# Patient Record
Sex: Female | Born: 1990
Health system: Southern US, Community
[De-identification: ages and names within clinical notes are randomized; demographics above are authoritative.]

## PROBLEM LIST (undated history)

## (undated) DIAGNOSIS — Z6711 Type A blood, Rh negative: Secondary | ICD-10-CM

## (undated) DIAGNOSIS — Z3400 Encounter for supervision of normal first pregnancy, unspecified trimester: Secondary | ICD-10-CM

## (undated) DIAGNOSIS — Z789 Other specified health status: Secondary | ICD-10-CM

## (undated) DIAGNOSIS — N390 Urinary tract infection, site not specified: Secondary | ICD-10-CM

## (undated) HISTORY — DX: Type A blood, Rh negative: Z67.11

## (undated) HISTORY — DX: Other specified health status: Z78.9

## (undated) HISTORY — PX: NO PAST SURGERIES: SHX2092

---
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
## 2016-10-14 ENCOUNTER — Encounter: Payer: Self-pay | Admitting: Obstetrics and Gynecology

## 2016-10-14 ENCOUNTER — Ambulatory Visit (INDEPENDENT_AMBULATORY_CARE_PROVIDER_SITE_OTHER): Payer: 59 | Admitting: Obstetrics and Gynecology

## 2016-10-14 VITALS — BP 102/62 | HR 82 | Ht 65.0 in | Wt 128.0 lb

## 2016-10-14 DIAGNOSIS — N3001 Acute cystitis with hematuria: Secondary | ICD-10-CM

## 2016-10-14 LAB — POCT URINALYSIS DIPSTICK
Bilirubin, UA: NEGATIVE
Glucose, UA: NEGATIVE
KETONES UA: NEGATIVE
Nitrite, UA: NEGATIVE
PROTEIN UA: NEGATIVE
SPEC GRAV UA: 1.01 (ref 1.010–1.025)
pH, UA: 6.5 (ref 5.0–8.0)

## 2016-10-14 MED ORDER — NITROFURANTOIN MONOHYD MACRO 100 MG PO CAPS: 100.0000 mg | ORAL_CAPSULE | Freq: Two times a day (BID) | ORAL | 0 refills | Status: AC

## 2016-10-14 MED FILL — NITROFURANTOIN MONO-MCR 100: 100 | 5 days supply | Qty: 10 | Fill #0

## 2016-10-14 NOTE — Progress Notes (Signed)
Chief Complaint  Patient presents with  . Urinary Tract Infection    HPI:      Jeanette [name] is a 26 y.o. G0P0000 who LMP was Patient's last menstrual period was 10/11/2016 (exact date)., presents today for UTI sx of dysuria since yesterday. No frequency/urgency/LBP/fevers. No vag sx. Hx of UTIs in the past. Drinks cranberry juice/AZO when gets sx in the past with sx relief. Having her menses currently.    Past Medical History:  Diagnosis Date  . No known health problems     Past Surgical History:  Procedure Laterality Date  . NO PAST SURGERIES      Family History  Problem Relation Age of Onset  . Hypertension Maternal Grandfather   . Thyroid disease Maternal Grandfather   . Diabetes Paternal Grandfather   . Colon cancer Other 41    Social History   Social History  . Marital status: Single    Spouse name: N/A  . Number of children: N/A  . Years of education: N/A   Occupational History  . Not on file.   Social History Main Topics  . Smoking status: Never Smoker  . Smokeless tobacco: Never Used  . Alcohol use Yes     Comment: occasionally  . Drug use: No  . Sexual activity: Yes    Birth control/ protection: None, Condom   Other Topics Concern  . Not on file   Social History Narrative  . No narrative on file     Current Outpatient Prescriptions:  .  Biotin 1000 MCG CHEW, Chew by mouth., Disp: , Rfl:  .  nitrofurantoin, macrocrystal-monohydrate, (MACROBID) 100 MG capsule, Take 1 capsule (100 mg total) by mouth 2 (two) times daily., Disp: 10 capsule, Rfl: 0   ROS:  Review of Systems  Constitutional: Negative for fever.  Gastrointestinal: Negative for blood in stool, constipation, diarrhea, nausea and vomiting.  Genitourinary: Positive for dysuria. Negative for dyspareunia, flank pain, frequency, hematuria, urgency, vaginal bleeding, vaginal discharge and vaginal pain.  Musculoskeletal: Negative for back pain.  Skin: Negative for rash.      OBJECTIVE:   Vitals:  BP 102/62   Pulse 82   Ht 5\' 5"  (1.651 m)   Wt 128 lb (58.1 kg)   LMP 10/11/2016 (Exact Date)   BMI 21.30 kg/m   Physical Exam  Constitutional: She is oriented to person, place, and time and well-developed, well-nourished, and in no distress.  Abdominal: There is no CVA tenderness.  Neurological: She is alert and oriented to person, place, and time.  Psychiatric: Affect and judgment normal.  Vitals reviewed.   Results: Results for orders placed or performed in visit on 10/14/16 (from the past 24 hour(s))  POCT Urinalysis Dipstick     Status: Abnormal   Collection Time: 10/14/16 11:53 AM  Result Value Ref Range   Color, UA brown    Clarity, UA cloudy    Glucose, UA neg    Bilirubin, UA neg    Ketones, UA neg    Spec Grav, UA 1.010 1.010 - 1.025   Blood, UA large    pH, UA 6.5 5.0 - 8.0   Protein, UA neg    Urobilinogen, UA  0.2 or 1.0 E.U./dL   Nitrite, UA neg    Leukocytes, UA Large (3+) (A) Negative  Having menses currently.    Assessment/Plan: Acute cystitis with hematuria - Rx macrobid. Check C&S. F/u prn.  - Plan: POCT Urinalysis Dipstick, Urine Culture, nitrofurantoin, macrocrystal-monohydrate, (MACROBID) 100 MG  capsule    Meds ordered this encounter  Medications  . Biotin 1000 MCG CHEW    Sig: Chew by mouth.  . nitrofurantoin, macrocrystal-monohydrate, (MACROBID) 100 MG capsule    Sig: Take 1 capsule (100 mg total) by mouth 2 (two) times daily.    Dispense:  10 capsule    Refill:  0      Return if symptoms worsen or fail to improve.  Brittin Belnap B. Ertha Nabor, PA-C 10/14/2016 11:54 AM

## 2016-10-15 LAB — URINE CULTURE

## 2016-12-23 ENCOUNTER — Other Ambulatory Visit: Payer: Self-pay

## 2016-12-23 ENCOUNTER — Encounter: Payer: Self-pay | Admitting: Obstetrics and Gynecology

## 2016-12-23 ENCOUNTER — Ambulatory Visit
Admission: EM | Admit: 2016-12-23 | Discharge: 2016-12-23 | Disposition: A | Discharge status: Home / Self Care | Payer: 59 | Attending: Family Medicine | Admitting: Family Medicine

## 2016-12-23 DIAGNOSIS — J069 Acute upper respiratory infection, unspecified: Secondary | ICD-10-CM

## 2016-12-23 DIAGNOSIS — B9789 Other viral agents as the cause of diseases classified elsewhere: Principal | ICD-10-CM

## 2016-12-23 NOTE — ED Triage Notes (Signed)
Patient complains of a cough and fever since Friday. Patient states that she has not had much of an appetite since Friday either.

## 2017-02-23 ENCOUNTER — Encounter: Payer: Self-pay | Admitting: Emergency Medicine

## 2017-02-23 ENCOUNTER — Other Ambulatory Visit: Payer: Self-pay

## 2017-02-23 ENCOUNTER — Ambulatory Visit
Admission: EM | Admit: 2017-02-23 | Discharge: 2017-02-23 | Disposition: A | Discharge status: Home / Self Care | Payer: 59 | Attending: Family Medicine | Admitting: Family Medicine

## 2017-02-23 DIAGNOSIS — N3000 Acute cystitis without hematuria: Secondary | ICD-10-CM

## 2017-02-23 HISTORY — DX: Urinary tract infection, site not specified: N39.0

## 2017-02-23 LAB — URINALYSIS, COMPLETE (UACMP) WITH MICROSCOPIC

## 2017-02-23 MED ORDER — CEPHALEXIN 500 MG PO CAPS: 500.0000 mg | ORAL_CAPSULE | Freq: Two times a day (BID) | ORAL | 0 refills | Status: DC

## 2017-02-23 NOTE — ED Provider Notes (Signed)
[facility]    CSN: [id number] Arrival date & time: 02/23/17  0810     History   Chief Complaint Chief Complaint  Patient presents with  . Dysuria    APPT    HPI Jeanette [name] is a 27 y.o. female.   The history is provided by the patient.  Dysuria  Pain quality:  Burning Onset quality:  Sudden Duration:  4 days Timing:  Constant Progression:  Unchanged Chronicity:  New Recent urinary tract infections: no   Relieved by:  Nothing Ineffective treatments:  Phenazopyridine Urinary symptoms: frequent urination   Urinary symptoms: no hematuria and no bladder incontinence   Associated symptoms: no abdominal pain, no fever, no flank pain, no genital lesions, no nausea, no vaginal discharge and no vomiting   Risk factors: no hx of pyelonephritis, no hx of urolithiasis, no kidney transplant, not pregnant, no recurrent urinary tract infections, no renal cysts, no renal disease, no single kidney and no urinary catheter     Past Medical History:  Diagnosis Date  . No known health problems   . UTI (urinary tract infection)     There are no active problems to display for this patient.   Past Surgical History:  Procedure Laterality Date  . NO PAST SURGERIES      OB History    No data available       Home Medications    Prior to Admission medications   Medication Sig Start Date End Date Taking? Authorizing Provider  CRANBERRY EXTRACT PO Take 2 tablets by mouth daily.   Yes [provider]  Biotin 1000 MCG CHEW Chew by mouth.    [provider]  cephALEXin (KEFLEX) 500 MG capsule Take 1 capsule (500 mg total) by mouth 2 (two) times daily. 02/23/17   Norval Gable, MD    Family History Family History  Problem Relation Age of Onset  . Hypertension Father   . Hypertension Maternal Grandfather   . Thyroid disease Maternal Grandfather   . Diabetes Paternal Grandfather   . Colon cancer Other 53    Social History Social History    Tobacco Use  . Smoking status: Never Smoker  . Smokeless tobacco: Never Used  Substance Use Topics  . Alcohol use: Yes    Frequency: Never    Comment: occasionally  . Drug use: No     Allergies   Patient has no known allergies.   Review of Systems Review of Systems  Constitutional: Negative for fever.  Gastrointestinal: Negative for abdominal pain, nausea and vomiting.  Genitourinary: Positive for dysuria. Negative for flank pain and vaginal discharge.     Physical Exam Triage Vital Signs ED Triage Vitals [02/23/17 0832]  Enc Vitals Group     BP 112/68     Pulse Rate 98     Resp 16     Temp 97.8 F (36.6 C)     Temp Source Oral     SpO2 99 %     Weight 114 lb (51.7 kg)     Height 5\' 5"  (1.651 m)     Head Circumference      Peak Flow      Pain Score 0     Pain Loc      Pain Edu?      Excl. in [place]?    No data found.  Updated Vital Signs BP 112/68 (BP Location: Left Arm)   Pulse 98   Temp 97.8 F (36.6 C) (Oral)   Resp  16   Ht 5\' 5"  (1.651 m)   Wt 114 lb (51.7 kg)   LMP 02/08/2017   SpO2 99%   BMI 18.97 kg/m   Visual Acuity Right Eye Distance:   Left Eye Distance:   Bilateral Distance:    Right Eye Near:   Left Eye Near:    Bilateral Near:     Physical Exam  Constitutional: She appears well-developed and well-nourished. No distress.  Abdominal: Soft. Bowel sounds are normal. She exhibits no distension and no mass. There is no tenderness. There is no rebound and no guarding.  Skin: She is not diaphoretic.  Nursing note and vitals reviewed.    UC Treatments / Results  Labs (all labs ordered are listed, but only abnormal results are displayed) Labs Reviewed  URINALYSIS, COMPLETE (UACMP) WITH MICROSCOPIC - Abnormal; Notable for the following components:      Result Value   Color, Urine ORANGE (*)    APPearance CLOUDY (*)    Glucose, UA   (*)    Value: TEST NOT REPORTED DUE TO COLOR INTERFERENCE OF URINE PIGMENT   Hgb urine dipstick    (*)    Value: TEST NOT REPORTED DUE TO COLOR INTERFERENCE OF URINE PIGMENT   Bilirubin Urine   (*)    Value: TEST NOT REPORTED DUE TO COLOR INTERFERENCE OF URINE PIGMENT   Ketones, ur   (*)    Value: TEST NOT REPORTED DUE TO COLOR INTERFERENCE OF URINE PIGMENT   Protein, ur   (*)    Value: TEST NOT REPORTED DUE TO COLOR INTERFERENCE OF URINE PIGMENT   Nitrite   (*)    Value: TEST NOT REPORTED DUE TO COLOR INTERFERENCE OF URINE PIGMENT   Leukocytes, UA   (*)    Value: TEST NOT REPORTED DUE TO COLOR INTERFERENCE OF URINE PIGMENT   Squamous Epithelial / LPF 0-5 (*)    Bacteria, UA FEW (*)    All other components within normal limits  URINE CULTURE    EKG  EKG Interpretation None       Radiology No results found.  Procedures Procedures (including critical care time)  Medications Ordered in UC Medications - No data to display   Initial Impression / Assessment and Plan / UC Course  I have reviewed the triage vital signs and the nursing notes.  Pertinent labs & imaging results that were available during my care of the patient were reviewed by me and considered in my medical decision making (see chart for details).       Final Clinical Impressions(s) / UC Diagnoses   Final diagnoses:  Acute cystitis without hematuria    ED Discharge Orders        Ordered    cephALEXin (KEFLEX) 500 MG capsule  2 times daily     02/23/17 0914     1. Lab results and diagnosis reviewed with patient 2. rx as per orders above; reviewed possible side effects, interactions, risks and benefits  3. Recommend supportive treatment with increased water intake 4. Follow-up prn if symptoms worsen or don't improve  Controlled Substance Prescriptions [place] Controlled Substance Registry consulted? Not Applicable   Norval Gable, MD 02/23/17 [phone]

## 2017-02-23 NOTE — ED Triage Notes (Signed)
Patient in today c/o 4 day history of urinary frequency and dysuria. Patient has been using AZO and taking cranberry. Patient denies vaginal discharge.

## 2017-02-25 LAB — URINE CULTURE
Culture: 10000 — AB
Special Requests: NORMAL

## 2017-04-01 ENCOUNTER — Ambulatory Visit (INDEPENDENT_AMBULATORY_CARE_PROVIDER_SITE_OTHER): Payer: 59 | Admitting: Obstetrics and Gynecology

## 2017-04-01 ENCOUNTER — Encounter: Payer: Self-pay | Admitting: Obstetrics and Gynecology

## 2017-04-01 ENCOUNTER — Ambulatory Visit: Payer: 59 | Admitting: Obstetrics and Gynecology

## 2017-04-01 VITALS — BP 114/70 | Ht 65.0 in | Wt 118.0 lb

## 2017-04-01 DIAGNOSIS — N390 Urinary tract infection, site not specified: Secondary | ICD-10-CM | POA: Diagnosis not present

## 2017-04-01 DIAGNOSIS — R300 Dysuria: Secondary | ICD-10-CM

## 2017-04-01 HISTORY — DX: Urinary tract infection, site not specified: N39.0

## 2017-04-01 LAB — POCT URINALYSIS DIPSTICK
BILIRUBIN UA: NEGATIVE
Blood, UA: NEGATIVE
GLUCOSE UA: NEGATIVE
Ketones, UA: NEGATIVE
NITRITE UA: POSITIVE
Protein, UA: NEGATIVE
SPEC GRAV UA: 1.025 (ref 1.010–1.025)
Urobilinogen, UA: NEGATIVE E.U./dL — AB
pH, UA: 6.5 (ref 5.0–8.0)

## 2017-04-01 MED ORDER — NITROFURANTOIN MONOHYD MACRO 100 MG PO CAPS: 100.0000 mg | ORAL_CAPSULE | Freq: Every day | ORAL | 1 refills | Status: DC

## 2017-04-01 MED ORDER — SULFAMETHOXAZOLE-TRIMETHOPRIM 800-160 MG PO TABS: 1.0000 | ORAL_TABLET | Freq: Two times a day (BID) | ORAL | 3 refills | Status: AC

## 2017-04-01 NOTE — Progress Notes (Signed)
Obstetrics & Gynecology Office Visit    Chief Complaint  Patient presents with  . Dysuria   History of Present Illness: Urinary Tract Infection: Patient complains of dysuria She has had symptoms for 3 days. Patient also complains of no other symptoms. Patient denies fevers, chills, nausea, vomiting, vaginal symptoms of itching, burning, irritation. Patient does have a history of recurrent UTI.  Patient does not have a history of pyelonephritis.   Past Medical History:  Diagnosis Date  . No known health problems   . UTI (urinary tract infection)     Past Surgical History:  Procedure Laterality Date  . NO PAST SURGERIES      Gynecologic History: Patient's last menstrual period was 03/09/2017.  Obstetric History: G0  Family History  Problem Relation Age of Onset  . Hypertension Father   . Hypertension Maternal Grandfather   . Thyroid disease Maternal Grandfather   . Diabetes Paternal Grandfather   . Colon cancer Other 72    Social History   Socioeconomic History  . Marital status: Married    Spouse name: Not on file  . Number of children: Not on file  . Years of education: Not on file  . Highest education level: Not on file  Occupational History  . Not on file  Social Needs  . Financial resource strain: Not on file  . Food insecurity:    Worry: Not on file    Inability: Not on file  . Transportation needs:    Medical: Not on file    Non-medical: Not on file  Tobacco Use  . Smoking status: Never Smoker  . Smokeless tobacco: Never Used  Substance and Sexual Activity  . Alcohol use: Yes    Frequency: Never    Comment: occasionally  . Drug use: No  . Sexual activity: Yes    Birth control/protection: None, Condom  Lifestyle  . Physical activity:    Days per week: Not on file    Minutes per session: Not on file  . Stress: Not on file  Relationships  . Social connections:    Talks on phone: Not on file    Gets together: Not on file    Attends religious  service: Not on file    Active member of club or organization: Not on file    Attends meetings of clubs or organizations: Not on file    Relationship status: Not on file  . Intimate partner violence:    Fear of current or ex partner: Not on file    Emotionally abused: Not on file    Physically abused: Not on file    Forced sexual activity: Not on file  Other Topics Concern  . Not on file  Social History Narrative   ** Merged History Encounter **       Allergies: No Known Allergies  Prior to Admission medications   Medication Sig Start Date End Date Taking? Authorizing Provider  CRANBERRY EXTRACT PO Take 2 tablets by mouth daily.   Yes [provider]  Biotin 1000 MCG CHEW Chew by mouth.    [provider]    Review of Systems  Constitutional: Negative.   HENT: Positive for congestion.   Eyes: Negative.   Respiratory: Negative.   Cardiovascular: Negative.   Gastrointestinal: Negative.   Genitourinary: Positive for dysuria.  Musculoskeletal: Negative.   Skin: Negative.   Neurological: Negative.   Psychiatric/Behavioral: Negative.      Physical Exam BP 114/70   Ht 5\' 5"  (1.651 m)  Wt 118 lb (53.5 kg)   LMP 03/09/2017   BMI 19.64 kg/m  Patient's last menstrual period was 03/09/2017. Physical Exam  Constitutional: She is oriented to person, place, and time. She appears well-developed and well-nourished. No distress.  HENT:  Head: Normocephalic and atraumatic.  Eyes: EOM are normal. No scleral icterus.  Neck: Normal range of motion. Neck supple.  Cardiovascular: Normal rate and regular rhythm.  Pulmonary/Chest: Effort normal and breath sounds normal. No respiratory distress. She has no wheezes. She has no rales.  Abdominal: Soft. Bowel sounds are normal. She exhibits no distension and no mass. There is no tenderness. There is no rebound and no guarding.  Musculoskeletal: Normal range of motion. She exhibits no edema.  Neurological: She is alert and  oriented to person, place, and time. No cranial nerve deficit.  Skin: Skin is warm and dry. No erythema.  Psychiatric: She has a normal mood and affect. Her behavior is normal. Judgment normal.   Female chaperone present for pelvic and breast  portions of the physical exam  Lab Results  Component Value Date   COLORU yellow 04/01/2017   CLARITYU cloudy 04/01/2017   GLUCOSEUR neg 04/01/2017   BILIRUBINUR neg 04/01/2017   KETONESU neg 04/01/2017   SPECGRAV 1.025 04/01/2017   RBCUR neg 04/01/2017   PHUR 6.5 04/01/2017   PROTEINUR neg 04/01/2017    Assessment: 27 y.o. No obstetric history on file. female here for  1. Recurrent UTI   2. Dysuria      Plan: Problem List Items Addressed This Visit      Genitourinary   Recurrent UTI - Primary   Relevant Medications   sulfamethoxazole-trimethoprim (BACTRIM DS,SEPTRA DS) 800-160 MG tablet   nitrofurantoin, macrocrystal-monohydrate, (MACROBID) 100 MG capsule    Other Visit Diagnoses    Dysuria       Relevant Medications   sulfamethoxazole-trimethoprim (BACTRIM DS,SEPTRA DS) 800-160 MG tablet   nitrofurantoin, macrocrystal-monohydrate, (MACROBID) 100 MG capsule   Other Relevant Orders   POCT Urinalysis Dipstick (Completed)   Urine Culture     Treat current UTI with bactrim. Will send culture and sensitivities to verify treating appropriately.   Given that she has frequent UTIs, will give her macrobid to take after intercourse for prevention. She was also given refills on bactrim should she develop symptoms of a UTI.   Prentice Docker, MD 04/01/2017 5:55 PM

## 2017-04-03 LAB — URINE CULTURE

## 2017-06-03 NOTE — ED Provider Notes (Signed)
[facility]    CSN: [id number] Arrival date & time: 12/23/16  1341     History   Chief Complaint Chief Complaint  Patient presents with  . Cough    HPI Jeanette [name] is a 27 y.o. female.   The history is provided by the patient.  Cough  Associated symptoms: rhinorrhea   Associated symptoms: no wheezing   URI  Presenting symptoms: cough and rhinorrhea   Severity:  Moderate Onset quality:  Sudden Duration:  3 days Timing:  Constant Progression:  Worsening Chronicity:  New Relieved by:  None tried Ineffective treatments:  None tried Associated symptoms: no sinus pain and no wheezing   Risk factors: sick contacts   Risk factors: not elderly, no chronic cardiac disease, no chronic kidney disease, no chronic respiratory disease, no immunosuppression, no recent illness and no recent travel     Past Medical History:  Diagnosis Date  . No known health problems   . UTI (urinary tract infection)     Patient Active Problem List   Diagnosis Date Noted  . Recurrent UTI 04/01/2017    Past Surgical History:  Procedure Laterality Date  . NO PAST SURGERIES      OB History   None      Home Medications    Prior to Admission medications   Medication Sig Start Date End Date Taking? Authorizing Provider  Biotin 1000 MCG CHEW Chew by mouth.    [provider]  CRANBERRY EXTRACT PO Take 2 tablets by mouth daily.    [provider]  nitrofurantoin, macrocrystal-monohydrate, (MACROBID) 100 MG capsule Take 1 capsule (100 mg total) by mouth at bedtime. On days after intercourse 04/01/17   Will Bonnet, MD    Family History Family History  Problem Relation Age of Onset  . Hypertension Father   . Hypertension Maternal Grandfather   . Thyroid disease Maternal Grandfather   . Diabetes Paternal Grandfather   . Colon cancer Other 70    Social History Social History   Tobacco Use  . Smoking status: Never Smoker  . Smokeless tobacco:  Never Used  Substance Use Topics  . Alcohol use: Yes    Frequency: Never    Comment: occasionally  . Drug use: No     Allergies   Patient has no known allergies.   Review of Systems Review of Systems  HENT: Positive for rhinorrhea. Negative for sinus pain.   Respiratory: Positive for cough. Negative for wheezing.      Physical Exam Triage Vital Signs ED Triage Vitals  Enc Vitals Group     BP 12/23/16 1414 109/77     Pulse Rate 12/23/16 1414 (!) 101     Resp 12/23/16 1414 18     Temp 12/23/16 1414 98.9 F (37.2 C)     Temp Source 12/23/16 1414 Oral     SpO2 12/23/16 1414 93 %     Weight 12/23/16 1411 121 lb (54.9 kg)     Height 12/23/16 1411 5' 5.5" (1.664 m)     Head Circumference --      Peak Flow --      Pain Score 12/23/16 1411 0     Pain Loc --      Pain Edu? --      Excl. in [place]? --    No data found.  Updated Vital Signs BP 109/77 (BP Location: Left Arm)   Pulse (!) 101   Temp 98.9 F (37.2 C) (Oral)   Resp  18   Ht 5' 5.5" (1.664 m)   Wt 121 lb (54.9 kg)   LMP 12/08/2016   SpO2 93%   BMI 19.83 kg/m   Visual Acuity Right Eye Distance:   Left Eye Distance:   Bilateral Distance:    Right Eye Near:   Left Eye Near:    Bilateral Near:     Physical Exam  Constitutional: She appears well-developed and well-nourished. No distress.  HENT:  Head: Normocephalic and atraumatic.  Right Ear: Tympanic membrane, external ear and ear canal normal.  Left Ear: Tympanic membrane, external ear and ear canal normal.  Nose: Rhinorrhea present. No mucosal edema, nose lacerations, sinus tenderness, nasal deformity, septal deviation or nasal septal hematoma. No epistaxis.  No foreign bodies. Right sinus exhibits no maxillary sinus tenderness and no frontal sinus tenderness. Left sinus exhibits no maxillary sinus tenderness and no frontal sinus tenderness.  Mouth/Throat: Uvula is midline, oropharynx is clear and moist and mucous membranes are normal. No oropharyngeal  exudate.  Eyes: Conjunctivae are normal. Right eye exhibits no discharge. Left eye exhibits no discharge. No scleral icterus.  Neck: Normal range of motion. Neck supple. No thyromegaly present.  Cardiovascular: Normal rate, regular rhythm and normal heart sounds.  Pulmonary/Chest: Effort normal and breath sounds normal. No stridor. No respiratory distress. She has no wheezes. She has no rales.  Lymphadenopathy:    She has no cervical adenopathy.  Skin: She is not diaphoretic.  Nursing note and vitals reviewed.    UC Treatments / Results  Labs (all labs ordered are listed, but only abnormal results are displayed) Labs Reviewed - No data to display  EKG None  Radiology No results found.  Procedures Procedures (including critical care time)  Medications Ordered in UC Medications - No data to display  Initial Impression / Assessment and Plan / UC Course  I have reviewed the triage vital signs and the nursing notes.  Pertinent labs & imaging results that were available during my care of the patient were reviewed by me and considered in my medical decision making (see chart for details).      Final Clinical Impressions(s) / UC Diagnoses   Final diagnoses:  Viral URI with cough    ED Prescriptions    None     1.diagnosis reviewed with patient 2.Recommend supportive treatment with rest, fluids, otc meds 3. Follow-up prn if symptoms worsen or don't improve  Controlled Substance Prescriptions [place] Controlled Substance Registry consulted? Not Applicable   Norval Gable, MD 06/03/17 2019

## 2017-06-26 DIAGNOSIS — N3000 Acute cystitis without hematuria: Secondary | ICD-10-CM | POA: Diagnosis not present

## 2017-06-30 ENCOUNTER — Other Ambulatory Visit (HOSPITAL_COMMUNITY)
Admission: RE | Admit: 2017-06-30 | Discharge: 2017-06-30 | Disposition: A | Discharge status: Home / Self Care | Payer: 59 | Source: Other Acute Inpatient Hospital | Attending: Internal Medicine | Admitting: Internal Medicine

## 2017-06-30 ENCOUNTER — Other Ambulatory Visit (HOSPITAL_COMMUNITY): Admit: 2017-06-30 | Payer: Self-pay

## 2017-06-30 DIAGNOSIS — R300 Dysuria: Secondary | ICD-10-CM | POA: Diagnosis not present

## 2017-06-30 DIAGNOSIS — Z0000 Encounter for general adult medical examination without abnormal findings: Secondary | ICD-10-CM | POA: Diagnosis not present

## 2017-06-30 LAB — COMPREHENSIVE METABOLIC PANEL
ALT: 25 U/L (ref 0–44)
AST: 24 U/L (ref 15–41)
Albumin: 4.8 g/dL (ref 3.5–5.0)
Alkaline Phosphatase: 53 U/L (ref 38–126)
Anion gap: 10 (ref 5–15)
BUN: 17 mg/dL (ref 6–20)
CO2: 23 mmol/L (ref 22–32)
Calcium: 9.4 mg/dL (ref 8.9–10.3)
Chloride: 102 mmol/L (ref 98–111)
Creatinine, Ser: 0.96 mg/dL (ref 0.44–1.00)
GFR calc Af Amer: >60 mL/min (ref >60)
GFR calc non Af Amer: >60 mL/min (ref >60)
Glucose, Bld: 81 mg/dL (ref 70–99)
Potassium: 3.4 mmol/L — ABNORMAL LOW (ref 3.5–5.1)
Sodium: 135 mmol/L (ref 135–145)
Total Bilirubin: 1 mg/dL (ref 0.3–1.2)
Total Protein: 7.8 g/dL (ref 6.5–8.1)

## 2017-06-30 LAB — CBC
HCT: 40.1 % (ref 36.0–46.0)
Hemoglobin: 12.8 g/dL (ref 12.0–15.0)
MCH: 28.8 pg (ref 26.0–34.0)
MCHC: 31.9 g/dL (ref 30.0–36.0)
MCV: 90.3 fL (ref 78.0–100.0)
Platelets: 209 10*3/uL (ref 150–400)
RBC: 4.44 MIL/uL (ref 3.87–5.11)
RDW: 12.5 % (ref 11.5–15.5)
WBC: 10.1 10*3/uL (ref 4.0–10.5)

## 2017-06-30 LAB — URINALYSIS, COMPLETE (UACMP) WITH MICROSCOPIC
Bilirubin Urine: NEGATIVE
Glucose, UA: NEGATIVE mg/dL
Hgb urine dipstick: NEGATIVE
Ketones, ur: 5 mg/dL — AB
Leukocytes, UA: NEGATIVE
Nitrite: NEGATIVE
Protein, ur: NEGATIVE mg/dL
Specific Gravity, Urine: 1.004 — ABNORMAL LOW (ref 1.005–1.030)
pH: 5 (ref 5.0–8.0)

## 2017-06-30 LAB — LIPID PANEL
Cholesterol: 159 mg/dL (ref 0–200)
HDL: 67 mg/dL (ref >40)
LDL Cholesterol: 87 mg/dL (ref 0–99)
Total CHOL/HDL Ratio: 2.4 RATIO
Triglycerides: 24 mg/dL (ref <150)
VLDL: 5 mg/dL (ref 0–40)

## 2017-06-30 LAB — TSH: TSH: 1.205 u[IU]/mL (ref 0.350–4.500)

## 2017-12-28 ENCOUNTER — Telehealth: Payer: Self-pay

## 2017-12-28 NOTE — Telephone Encounter (Signed)
Pt is early preg; has NOB on 01/15/18; had some spotting last night.  Is concerned.  [phone]  Pt had intercourse within 24hrs of spotting starting.  Adv probably from that as cx is easier to make bleed.  To monitor and is gets to be like a period to be seen.

## 2018-01-06 NOTE — L&D Delivery Note (Signed)
Obstetrical Delivery Note   Date of Delivery:   08/27/2018 Primary OB:   [facility] Gestational Age/EDD: [redacted]w[redacted]d (Dated by LMP) Antepartum complications: none  Delivered By:   Dalia Heading, CNM  Delivery Type:   spontaneous vaginal delivery  Procedure Details:   Mother pushed to deliver a vigorous female infant in LOA with loose nuchal cord x 1 that was reduced over the shoulder. Baby dried and bulb suctioned and placed on mother's abdomen. After a delayed cord clamping the father of the baby cut the cord. Baby then placed skin to skin with the mother. Spontaneous delivery of intact placenta and 3 vessel cord with some trailing membranes. Uterine atony resolved with fundal massage, IV Pitocin and Methergine 0.2 mgm IM. Repair of second degree perineal laceration with 3-0 Chromic and 2-0 Vicryl under local anesthesia.  EBL350 ml.  Anesthesia:    local Intrapartum complications: Variable decelerations in second stage GBS:    negative Laceration:    2nd degree perineal Episiotomy:    none Placenta:    Via active 3rd stage. To pathology: no Estimated Blood Loss:  350 ml Baby:    Liveborn female, Apgars 9/9, weight pending    Dalia Heading, CNM

## 2018-01-15 ENCOUNTER — Other Ambulatory Visit (HOSPITAL_COMMUNITY)
Admission: RE | Admit: 2018-01-15 | Discharge: 2018-01-15 | Disposition: A | Discharge status: Home / Self Care | Payer: 59 | Source: Ambulatory Visit | Attending: Obstetrics and Gynecology | Admitting: Obstetrics and Gynecology

## 2018-01-15 ENCOUNTER — Ambulatory Visit (INDEPENDENT_AMBULATORY_CARE_PROVIDER_SITE_OTHER): Payer: 59 | Admitting: Obstetrics and Gynecology

## 2018-01-15 ENCOUNTER — Encounter: Payer: Self-pay | Admitting: Obstetrics and Gynecology

## 2018-01-15 VITALS — BP 108/56 | Wt 131.0 lb

## 2018-01-15 DIAGNOSIS — D229 Melanocytic nevi, unspecified: Secondary | ICD-10-CM

## 2018-01-15 DIAGNOSIS — Z3401 Encounter for supervision of normal first pregnancy, first trimester: Secondary | ICD-10-CM | POA: Diagnosis not present

## 2018-01-15 DIAGNOSIS — Z124 Encounter for screening for malignant neoplasm of cervix: Secondary | ICD-10-CM | POA: Diagnosis not present

## 2018-01-15 DIAGNOSIS — Z3A08 8 weeks gestation of pregnancy: Secondary | ICD-10-CM

## 2018-01-15 LAB — OB RESULTS CONSOLE VARICELLA ZOSTER ANTIBODY, IGG: Varicella: IMMUNE

## 2018-01-15 NOTE — Progress Notes (Signed)
01/15/2018   Chief Complaint: Missed period  Transfer of Care Patient: no  History of Present Illness: Jeanette [name] is a 28 y.o. G1P0 [redacted]w[redacted]d based on Patient's last menstrual period was 11/16/2017 (exact date). with an Estimated Date of Delivery: 08/23/18, with the above CC.   Her periods were: regular periods every 28-30 days She was using no method when she conceived.  She has Negative signs or symptoms of nausea/vomiting of pregnancy. She has Negative signs or symptoms of miscarriage or preterm labor She identifies Negative Zika risk factors for her and her partner On any different medications around the time she conceived/early pregnancy: No  History of varicella: Yes   ROS: A 12-point review of systems was performed and negative, except as stated in the above HPI.  OBGYN History: As per HPI. OB History  Gravida Para Term Preterm AB Living  1            SAB TAB Ectopic Multiple Live Births               # Outcome Date GA Lbr Len/2nd Weight Sex Delivery Anes PTL Lv  1 Current             Any issues with any prior pregnancies: not applicable Any prior children are healthy, doing well, without any problems or issues: not applicable History of pap smears: Yes. Last pap smear Normal History of STIs: No   Past Medical History: Past Medical History:  Diagnosis Date  . No known health problems   . UTI (urinary tract infection)     Past Surgical History: Past Surgical History:  Procedure Laterality Date  . NO PAST SURGERIES      Family History:  Family History  Problem Relation Age of Onset  . Hypertension Father   . Hypertension Maternal Grandfather   . Thyroid disease Maternal Grandfather   . Diabetes Paternal Grandfather   . Colon cancer Other 60   She denies any female cancers, bleeding or blood clotting disorders.  She denies any history of mental retardation, birth defects or genetic disorders in her or the FOB's history  Social History:  Social History    Socioeconomic History  . Marital status: Married    Spouse name: Not on file  . Number of children: Not on file  . Years of education: Not on file  . Highest education level: Not on file  Occupational History  . Not on file  Social Needs  . Financial resource strain: Not on file  . Food insecurity:    Worry: Not on file    Inability: Not on file  . Transportation needs:    Medical: Not on file    Non-medical: Not on file  Tobacco Use  . Smoking status: Never Smoker  . Smokeless tobacco: Never Used  Substance and Sexual Activity  . Alcohol use: Not Currently    Frequency: Never    Comment: occasionally  . Drug use: No  . Sexual activity: Yes    Birth control/protection: None  Lifestyle  . Physical activity:    Days per week: Not on file    Minutes per session: Not on file  . Stress: Not on file  Relationships  . Social connections:    Talks on phone: Not on file    Gets together: Not on file    Attends religious service: Not on file    Active member of club or organization: Not on file    Attends meetings of clubs or  organizations: Not on file    Relationship status: Not on file  . Intimate partner violence:    Fear of current or ex partner: Not on file    Emotionally abused: Not on file    Physically abused: Not on file    Forced sexual activity: Not on file  Other Topics Concern  . Not on file  Social History Narrative   ** Merged History Encounter **       Any pets in the household: She had dogs, discussed not changing cat liter when pregnant    Allergy: No Known Allergies  Current Outpatient Medications:  Current Outpatient Medications:  .  ferrous sulfate 325 (65 FE) MG tablet, Take 325 mg by mouth [name] with breakfast., Disp: , Rfl:  .  Prenatal MV-Min-FA-Omega-3 (PRENATAL GUMMIES/DHA & FA) 0.4-32.5 MG CHEW, Chew by mouth., Disp: , Rfl:    Physical Exam:   BP (!) 108/56   Wt 131 lb (59.4 kg)   LMP 11/16/2017 (Exact Date)   BMI 21.80 kg/m   Body mass index is 21.8 kg/m. Constitutional: Well nourished, well developed female in no acute distress.  Neck:  Supple, normal appearance, and no thyromegaly  Cardiovascular: S1, S2 normal, no murmur, rub or gallop, regular rate and rhythm Respiratory:  Clear to auscultation bilateral. Normal respiratory effort Abdomen: positive bowel sounds and no masses, hernias; diffusely non tender to palpation, non distended Breasts: breasts appear normal, no suspicious masses, no skin or nipple changes or axillary nodes. Neuro/Psych:  Normal mood and affect.  Skin:  Warm and dry. Multiple moles on back, chest and abdomen.  Lymphatic:  No inguinal lymphadenopathy.   Pelvic exam: is not limited by body habitus EGBUS: within normal limits, Vagina: within normal limits and with no blood in the vault, Cervix: normal appearing cervix without discharge or lesions, closed/long/high, Uterus:  nonenlarged, and Adnexa:  normal adnexa and no mass, fullness, tenderness  Assessment: Jeanette [name] is a 28 y.o. G1P0 [redacted]w[redacted]d based on Patient's last menstrual period was 11/16/2017 (exact date). with an Estimated Date of Delivery: 08/23/18,  for prenatal care.  Plan:  1) Avoid alcoholic beverages. 2) Patient encouraged not to smoke.  3) Discontinue the use of all non-medicinal drugs and chemicals.  4) Take prenatal vitamins [name].  5) Seatbelt use advised 6) Nutrition, food safety (fish, cheese advisories, and high nitrite foods) and exercise discussed. 7) Hospital and practice style delivering at [facility] discussed  8) Patient is asked about travel to areas at risk for the [place] virus, and counseled to avoid travel and exposure to mosquitoes or sexual partners who may have themselves been exposed to the virus. Testing is discussed, and will be ordered as appropriate.  9) Childbirth classes at [facility] advised 10) Genetic Screening, such as with 1st Trimester Screening, cell free fetal DNA, AFP testing, and Ultrasound, as well as  with amniocentesis and CVS as appropriate, is discussed with patient. She plans to discuss genetic testing this pregnancy, she is undecided.  Dermatology referral for multiple moles Dating [place] next week Undecided genetic screening  Problem list reviewed and updated.  Adrian Prows MD [facility], [facility] 01/15/2018 9:39 AM

## 2018-01-15 NOTE — Progress Notes (Signed)
NOB C/o nausea, and some spotting Flu shot in September

## 2018-01-16 LAB — RPR+RH+ABO+RUB AB+AB SCR+CB...
Antibody Screen: NEGATIVE
HEMATOCRIT: 36.2 % (ref 34.0–46.6)
HEMOGLOBIN: 11.8 g/dL (ref 11.1–15.9)
HEP B S AG: NEGATIVE
HIV Screen 4th Generation wRfx: NONREACTIVE
MCH: 28.9 pg (ref 26.6–33.0)
MCHC: 32.6 g/dL (ref 31.5–35.7)
MCV: 89 fL (ref 79–97)
Platelets: 217 10*3/uL (ref 150–450)
RBC: 4.08 x10E6/uL (ref 3.77–5.28)
RDW: 12.6 % (ref 11.7–15.4)
RH TYPE: NEGATIVE
RPR: NONREACTIVE
RUBELLA: 7.43 {index} (ref >0.99)
Varicella zoster IgG: 3447 index (ref >165)
WBC: 9.1 10*3/uL (ref 3.4–10.8)

## 2018-01-17 LAB — URINE CULTURE: ORGANISM ID, BACTERIA: NO GROWTH

## 2018-01-18 DIAGNOSIS — Z3400 Encounter for supervision of normal first pregnancy, unspecified trimester: Secondary | ICD-10-CM | POA: Insufficient documentation

## 2018-01-18 HISTORY — DX: Encounter for supervision of normal first pregnancy, unspecified trimester: Z34.00

## 2018-01-18 LAB — MONITOR DRUG PROFILE 10(MW)
AMPHETAMINE SCREEN URINE: NEGATIVE ng/mL
BARBITURATE SCREEN URINE: NEGATIVE ng/mL
BENZODIAZEPINE SCREEN, URINE: NEGATIVE ng/mL
CANNABINOIDS UR QL SCN: NEGATIVE ng/mL
CREATININE(CRT), U: 139 mg/dL (ref 20.0–300.0)
Cocaine (Metab) Scrn, Ur: NEGATIVE ng/mL
METHADONE SCREEN, URINE: NEGATIVE ng/mL
OXYCODONE+OXYMORPHONE UR QL SCN: NEGATIVE ng/mL
Opiate Scrn, Ur: NEGATIVE ng/mL
PH UR, DRUG SCRN: 6.4 (ref 4.5–8.9)
PHENCYCLIDINE QUANTITATIVE URINE: NEGATIVE ng/mL
PROPOXYPHENE SCREEN URINE: NEGATIVE ng/mL

## 2018-01-18 LAB — CERVICOVAGINAL ANCILLARY ONLY
Chlamydia: NEGATIVE
NEISSERIA GONORRHEA: NEGATIVE

## 2018-01-18 LAB — CYTOLOGY - PAP: DIAGNOSIS: NEGATIVE

## 2018-01-18 NOTE — Progress Notes (Signed)
Normal, released to mychart

## 2018-01-19 ENCOUNTER — Encounter: Payer: Self-pay | Admitting: Advanced Practice Midwife

## 2018-01-19 ENCOUNTER — Ambulatory Visit (INDEPENDENT_AMBULATORY_CARE_PROVIDER_SITE_OTHER): Payer: 59 | Admitting: Advanced Practice Midwife

## 2018-01-19 ENCOUNTER — Ambulatory Visit (INDEPENDENT_AMBULATORY_CARE_PROVIDER_SITE_OTHER): Payer: 59

## 2018-01-19 VITALS — BP 128/60 | Wt 134.0 lb

## 2018-01-19 DIAGNOSIS — Z3401 Encounter for supervision of normal first pregnancy, first trimester: Secondary | ICD-10-CM

## 2018-01-19 DIAGNOSIS — Z3A09 9 weeks gestation of pregnancy: Secondary | ICD-10-CM

## 2018-01-19 LAB — POCT URINALYSIS DIPSTICK OB
Glucose, UA: NEGATIVE
PROTEIN: NEGATIVE

## 2018-01-19 NOTE — Progress Notes (Signed)
ROB Dating scan 

## 2018-01-19 NOTE — Progress Notes (Signed)
  Routine Prenatal Care Visit  Subjective  Jeanette [name] is a 28 y.o. G1P0 at [redacted]w[redacted]d being seen today for ongoing prenatal care.  She is currently monitored for the following issues for this low-risk pregnancy and has Recurrent UTI and Supervision of normal first pregnancy on their problem list.  ----------------------------------------------------------------------------------- Patient reports no complaints.  She has mild nausea. She denies need for medication.  . Vag. Bleeding: None.   . Denies leaking of fluid.  ----------------------------------------------------------------------------------- The following portions of the patient's history were reviewed and updated as appropriate: allergies, current medications, past family history, past medical history, past social history, past surgical history and problem list. Problem list updated.   Objective  Blood pressure 128/60, weight 134 lb (60.8 kg), last menstrual period 11/16/2017. Pregravid weight 118 lb (53.5 kg) Total Weight Gain 16 lb (7.258 kg) Urinalysis: Urine Protein Negative  Urine Glucose Negative  Fetal Status: Fetal Heart Rate (bpm): 185         Dating scan: agrees with LMP within 4 days   General:  Alert, oriented and cooperative. Patient is in no acute distress.  Skin: Skin is warm and dry. No rash noted.   Cardiovascular: Normal heart rate noted  Respiratory: Normal respiratory effort, no problems with respiration noted  Abdomen: Soft, gravid, appropriate for gestational age.       Pelvic:  Cervical exam deferred        Extremities: Normal range of motion.     Mental Status: Normal mood and affect. Normal behavior. Normal judgment and thought content.   Assessment   28 y.o. G1P0 at [redacted]w[redacted]d by  08/23/2018, by Last Menstrual Period presenting for routine prenatal visit  Plan   Pregnancy #1 Problems (from 11/16/17 to present)    Problem Noted Resolved   Supervision of normal first pregnancy 01/18/2018 by Homero Fellers, MD No   Overview Signed 01/18/2018  3:50 PM by Homero Fellers, MD      Clinic [facility] Prenatal Labs  Dating  Blood type: A/Negative/-- (01/10 0945)   Genetic Screen 1 Screen:     AFP:      Quad:      NIPS:    Antibody:Negative (01/10 0945)  Anatomic [place]  Rubella: 7.43 (01/10 0945) Varicella: Immune  GTT Early:        28 wk:      RPR: Non Reactive (01/10 0945)   Rhogam  HBsAg: Negative (01/10 0945)   TDaP vaccine                       HIV: Non Reactive (01/10 0945)   Flu Shot   09/2017                             GBS:   Contraception  Pap:  CBB     CS/VBAC    Baby Food    Support Person                 Preterm labor symptoms and general obstetric precautions including but not limited to vaginal bleeding, contractions, leaking of fluid and fetal movement were reviewed in detail with the patient. Please refer to After Visit Summary for other counseling recommendations.   Return in about 4 weeks (around 02/16/2018) for rob.  Rod Can, CNM 01/19/2018 4:29 PM

## 2018-02-16 ENCOUNTER — Encounter: Payer: Self-pay | Admitting: Obstetrics and Gynecology

## 2018-02-16 ENCOUNTER — Ambulatory Visit (INDEPENDENT_AMBULATORY_CARE_PROVIDER_SITE_OTHER): Payer: 59 | Admitting: Obstetrics and Gynecology

## 2018-02-16 VITALS — BP 124/68 | Wt 137.0 lb

## 2018-02-16 DIAGNOSIS — Z3402 Encounter for supervision of normal first pregnancy, second trimester: Secondary | ICD-10-CM

## 2018-02-16 DIAGNOSIS — Z3A13 13 weeks gestation of pregnancy: Secondary | ICD-10-CM

## 2018-02-16 NOTE — Progress Notes (Signed)
    Routine Prenatal Care Visit  Subjective  Jeanette [name] is a 28 y.o. G1P0 at [redacted]w[redacted]d being seen today for ongoing prenatal care.  She is currently monitored for the following issues for this low-risk pregnancy and has Recurrent UTI and Supervision of normal first pregnancy on their problem list.  ----------------------------------------------------------------------------------- Patient reports no complaints.  Mild twinge or round ligament pain.  Contractions: Not present. Vag. Bleeding: None.  Movement: Absent. Denies leaking of fluid.  ----------------------------------------------------------------------------------- The following portions of the patient's history were reviewed and updated as appropriate: allergies, current medications, past family history, past medical history, past social history, past surgical history and problem list. Problem list updated.   Objective  Blood pressure 124/68, weight 137 lb (62.1 kg), last menstrual period 11/16/2017. Pregravid weight 118 lb (53.5 kg) Total Weight Gain 19 lb (8.618 kg) Urinalysis:      Fetal Status: Fetal Heart Rate (bpm): 166   Movement: Absent     General:  Alert, oriented and cooperative. Patient is in no acute distress.  Skin: Skin is warm and dry. No rash noted.   Cardiovascular: Normal heart rate noted  Respiratory: Normal respiratory effort, no problems with respiration noted  Abdomen: Soft, gravid, appropriate for gestational age. Pain/Pressure: Absent     Pelvic:  Cervical exam deferred        Extremities: Normal range of motion.  Edema: None  Mental Status: Normal mood and affect. Normal behavior. Normal judgment and thought content.     Assessment   28 y.o. G1P0 at [redacted]w[redacted]d by  08/23/2018, by Last Menstrual Period presenting for routine prenatal visit  Plan   Pregnancy #1 Problems (from 11/16/17 to present)    Problem Noted Resolved   Supervision of normal first pregnancy 01/18/2018 by Homero Fellers, MD No   Overview Addendum 02/16/2018  4:22 PM by Homero Fellers, MD      Clinic [facility] Prenatal Labs  Dating  LMP =8wk [place] Blood type: A/Negative/-- (01/10 0945)   Genetic Screen 1 Screen:     AFP:      Quad:      NIPS:    Antibody:Negative (01/10 0945)  Anatomic [place]  Rubella: 7.43 (01/10 0945)  Varicella: Immune  GTT Early:        28 wk:      RPR: Non Reactive (01/10 0945)   Rhogam  HBsAg: Negative (01/10 0945)   TDaP vaccine                       HIV: Non Reactive (01/10 0945)   Flu Shot   09/2017                             GBS:   Contraception  Pap: NIL 2020  CBB     CS/VBAC    Baby Food    Support Person                 Gestational age appropriate obstetric precautions including but not limited to vaginal bleeding, contractions, leaking of fluid and fetal movement were reviewed in detail with the patient.    Return in about 4 weeks (around 03/16/2018) for ROB.  Homero Fellers MD [facility], [facility] 02/16/2018, 4:33 PM

## 2018-02-16 NOTE — Progress Notes (Signed)
ROB Round ligament pain

## 2018-03-16 ENCOUNTER — Encounter: Payer: Self-pay | Admitting: Obstetrics and Gynecology

## 2018-03-16 ENCOUNTER — Ambulatory Visit (INDEPENDENT_AMBULATORY_CARE_PROVIDER_SITE_OTHER): Payer: 59 | Admitting: Obstetrics and Gynecology

## 2018-03-16 VITALS — BP 120/60 | Wt 142.0 lb

## 2018-03-16 DIAGNOSIS — Z3A17 17 weeks gestation of pregnancy: Secondary | ICD-10-CM

## 2018-03-16 DIAGNOSIS — Z3402 Encounter for supervision of normal first pregnancy, second trimester: Secondary | ICD-10-CM

## 2018-03-16 NOTE — Progress Notes (Signed)
    Routine Prenatal Care Visit  Subjective  Jeanette [name] is a 28 y.o. G1P0 at [redacted]w[redacted]d being seen today for ongoing prenatal care.  She is currently monitored for the following issues for this low-risk pregnancy and has Recurrent UTI and Supervision of normal first pregnancy on their problem list.  ----------------------------------------------------------------------------------- Patient reports no complaints.   Contractions: Not present. Vag. Bleeding: None.  Movement: Absent. Denies leaking of fluid.  ----------------------------------------------------------------------------------- The following portions of the patient's history were reviewed and updated as appropriate: allergies, current medications, past family history, past medical history, past social history, past surgical history and problem list. Problem list updated.   Objective  Blood pressure 120/60, weight 142 lb (64.4 kg), last menstrual period 11/16/2017. Pregravid weight 118 lb (53.5 kg) Total Weight Gain 24 lb (10.9 kg) Urinalysis:      Fetal Status: Fetal Heart Rate (bpm): 156   Movement: Absent     General:  Alert, oriented and cooperative. Patient is in no acute distress.  Skin: Skin is warm and dry. No rash noted.   Cardiovascular: Normal heart rate noted  Respiratory: Normal respiratory effort, no problems with respiration noted  Abdomen: Soft, gravid, appropriate for gestational age. Pain/Pressure: Absent     Pelvic:  Cervical exam deferred        Extremities: Normal range of motion.  Edema: None  Mental Status: Normal mood and affect. Normal behavior. Normal judgment and thought content.     Assessment   28 y.o. G1P0 at [redacted]w[redacted]d by  08/23/2018, by Last Menstrual Period presenting for routine prenatal visit  Plan   Pregnancy #1 Problems (from 11/16/17 to present)    Problem Noted Resolved   Supervision of normal first pregnancy 01/18/2018 by Homero Fellers, MD No   Overview Addendum 02/16/2018  4:22 PM  by Homero Fellers, MD      Clinic [facility] Prenatal Labs  Dating  LMP =8wk [place] Blood type: A/Negative/-- (01/10 0945)   Genetic Screen 1 Screen:     AFP:      Quad:      NIPS:    Antibody:Negative (01/10 0945)  Anatomic [place]  Rubella: 7.43 (01/10 0945)  Varicella: Immune  GTT Early:        28 wk:      RPR: Non Reactive (01/10 0945)   Rhogam  HBsAg: Negative (01/10 0945)   TDaP vaccine                       HIV: Non Reactive (01/10 0945)   Flu Shot   09/2017                             GBS:   Contraception  Pap: NIL 2020  CBB     CS/VBAC    Baby Food    Support Person                 Gestational age appropriate obstetric precautions including but not limited to vaginal bleeding, contractions, leaking of fluid and fetal movement were reviewed in detail with the patient.    Declines genetic screening Discussed breast feeding- given book and ready set baby pamphlet Given list of prenatal classes  Return in about 3 weeks (around 04/06/2018) for [place] and [place].  Homero Fellers MD [facility], [facility] 03/16/2018, 4:21 PM

## 2018-03-16 NOTE — Progress Notes (Signed)
ROB No concerns 

## 2018-04-15 ENCOUNTER — Other Ambulatory Visit: Payer: 59

## 2018-04-15 ENCOUNTER — Other Ambulatory Visit: Payer: Self-pay | Admitting: Obstetrics and Gynecology

## 2018-04-15 ENCOUNTER — Other Ambulatory Visit: Payer: Self-pay

## 2018-04-15 ENCOUNTER — Ambulatory Visit (INDEPENDENT_AMBULATORY_CARE_PROVIDER_SITE_OTHER): Payer: 59

## 2018-04-15 ENCOUNTER — Encounter: Payer: 59 | Admitting: Obstetrics and Gynecology

## 2018-04-15 ENCOUNTER — Ambulatory Visit (INDEPENDENT_AMBULATORY_CARE_PROVIDER_SITE_OTHER): Payer: 59 | Admitting: Certified Nurse Midwife

## 2018-04-15 VITALS — BP 100/56 | Wt 146.0 lb

## 2018-04-15 DIAGNOSIS — Z3402 Encounter for supervision of normal first pregnancy, second trimester: Secondary | ICD-10-CM

## 2018-04-15 DIAGNOSIS — Z3A21 21 weeks gestation of pregnancy: Secondary | ICD-10-CM

## 2018-04-15 DIAGNOSIS — Z363 Encounter for antenatal screening for malformations: Secondary | ICD-10-CM

## 2018-04-15 LAB — POCT URINALYSIS DIPSTICK OB
Glucose, UA: NEGATIVE
POC,PROTEIN,UA: NEGATIVE

## 2018-04-15 NOTE — Progress Notes (Signed)
Anatomy scan today. No complaints.

## 2018-04-16 NOTE — Progress Notes (Signed)
Anatomy scan and ROB at 21wk3d: CGA 20wk6d with normal anatomy, anterior placenta, female Good FM No problems offered ROB in 4 weeks (may be a telephone appointment) Dalia Heading, CNM

## 2018-05-13 ENCOUNTER — Ambulatory Visit (INDEPENDENT_AMBULATORY_CARE_PROVIDER_SITE_OTHER): Payer: 59 | Admitting: Obstetrics & Gynecology

## 2018-05-13 ENCOUNTER — Other Ambulatory Visit: Payer: Self-pay

## 2018-05-13 VITALS — Wt 152.0 lb

## 2018-05-13 DIAGNOSIS — Z3402 Encounter for supervision of normal first pregnancy, second trimester: Secondary | ICD-10-CM

## 2018-05-13 DIAGNOSIS — Z3A25 25 weeks gestation of pregnancy: Secondary | ICD-10-CM

## 2018-05-13 DIAGNOSIS — Z131 Encounter for screening for diabetes mellitus: Secondary | ICD-10-CM

## 2018-05-13 NOTE — Patient Instructions (Signed)
Third Trimester of Pregnancy The third trimester is from week 28 through week 40 (months 7 through 9). The third trimester is a time when the unborn baby (fetus) is growing rapidly. At the end of the ninth month, the fetus is about 20 inches in length and weighs 6-10 pounds. Body changes during your third trimester Your body will continue to go through many changes during pregnancy. The changes vary from woman to woman. During the third trimester:  Your weight will continue to increase. You can expect to gain 25-35 pounds (11-16 kg) by the end of the pregnancy.  You may begin to get stretch marks on your hips, abdomen, and breasts.  You may urinate more often because the fetus is moving lower into your pelvis and pressing on your bladder.  You may develop or continue to have heartburn. This is caused by increased hormones that slow down muscles in the digestive tract.  You may develop or continue to have constipation because increased hormones slow digestion and cause the muscles that push waste through your intestines to relax.  You may develop hemorrhoids. These are swollen veins (varicose veins) in the rectum that can itch or be painful.  You may develop swollen, bulging veins (varicose veins) in your legs.  You may have increased body aches in the pelvis, back, or thighs. This is due to weight gain and increased hormones that are relaxing your joints.  You may have changes in your hair. These can include thickening of your hair, rapid growth, and changes in texture. Some women also have hair loss during or after pregnancy, or hair that feels dry or thin. Your hair will most likely return to normal after your baby is born.  Your breasts will continue to grow and they will continue to become tender. A yellow fluid (colostrum) may leak from your breasts. This is the first milk you are producing for your baby.  Your belly button may stick out.  You may notice more swelling in your hands,  face, or ankles.  You may have increased tingling or numbness in your hands, arms, and legs. The skin on your belly may also feel numb.  You may feel short of breath because of your expanding uterus.  You may have more problems sleeping. This can be caused by the size of your belly, increased need to urinate, and an increase in your body's metabolism.  You may notice the fetus "dropping," or moving lower in your abdomen (lightening).  You may have increased vaginal discharge.  You may notice your joints feel loose and you may have pain around your pelvic bone. What to expect at prenatal visits You will have prenatal exams every 2 weeks until week 36. Then you will have weekly prenatal exams. During a routine prenatal visit:  You will be weighed to make sure you and the baby are growing normally.  Your blood pressure will be taken.  Your abdomen will be measured to track your baby's growth.  The fetal heartbeat will be listened to.  Any test results from the previous visit will be discussed.  You may have a cervical check near your due date to see if your cervix has softened or thinned (effaced).  You will be tested for Group B streptococcus. This happens between 35 and 37 weeks. Your health care provider may ask you:  What your birth plan is.  How you are feeling.  If you are feeling the baby move.  If you have had any abnormal   symptoms, such as leaking fluid, bleeding, severe headaches, or abdominal cramping.  If you are using any tobacco products, including cigarettes, chewing tobacco, and electronic cigarettes.  If you have any questions. Other tests or screenings that may be performed during your third trimester include:  Blood tests that check for low iron levels (anemia).  Fetal testing to check the health, activity level, and growth of the fetus. Testing is done if you have certain medical conditions or if there are problems during the pregnancy.  Nonstress test  (NST). This test checks the health of your baby to make sure there are no signs of problems, such as the baby not getting enough oxygen. During this test, a belt is placed around your belly. The baby is made to move, and its heart rate is monitored during movement. What is false labor? False labor is a condition in which you feel small, irregular tightenings of the muscles in the womb (contractions) that usually go away with rest, changing position, or drinking water. These are called Braxton Hicks contractions. Contractions may last for hours, days, or even weeks before true labor sets in. If contractions come at regular intervals, become more frequent, increase in intensity, or become painful, you should see your health care provider. What are the signs of labor?  Abdominal cramps.  Regular contractions that start at 10 minutes apart and become stronger and more frequent with time.  Contractions that start on the top of the uterus and spread down to the lower abdomen and back.  Increased pelvic pressure and dull back pain.  A watery or bloody mucus discharge that comes from the vagina.  Leaking of amniotic fluid. This is also known as your "water breaking." It could be a slow trickle or a gush. Let your health care provider know if it has a color or strange odor. If you have any of these signs, call your health care provider right away, even if it is before your due date. Follow these instructions at home: Medicines  Follow your health care provider's instructions regarding medicine use. Specific medicines may be either safe or unsafe to take during pregnancy.  Take a prenatal vitamin that contains at least 600 micrograms (mcg) of folic acid.  If you develop constipation, try taking a stool softener if your health care provider approves. Eating and drinking   Eat a balanced diet that includes fresh fruits and vegetables, whole grains, good sources of protein such as meat, eggs, or tofu,  and low-fat dairy. Your health care provider will help you determine the amount of weight gain that is right for you.  Avoid raw meat and uncooked cheese. These carry germs that can cause birth defects in the baby.  If you have low calcium intake from food, talk to your health care provider about whether you should take a daily calcium supplement.  Eat four or five small meals rather than three large meals a day.  Limit foods that are high in fat and processed sugars, such as fried and sweet foods.  To prevent constipation: ? Drink enough fluid to keep your urine clear or pale yellow. ? Eat foods that are high in fiber, such as fresh fruits and vegetables, whole grains, and beans. Activity  Exercise only as directed by your health care provider. Most women can continue their usual exercise routine during pregnancy. Try to exercise for 30 minutes at least 5 days a week. Stop exercising if you experience uterine contractions.  Avoid heavy lifting.  Do   not exercise in extreme heat or humidity, or at high altitudes.  Wear low-heel, comfortable shoes.  Practice good posture.  You may continue to have sex unless your health care provider tells you otherwise. Relieving pain and discomfort  Take frequent breaks and rest with your legs elevated if you have leg cramps or low back pain.  Take warm sitz baths to soothe any pain or discomfort caused by hemorrhoids. Use hemorrhoid cream if your health care provider approves.  Wear a good support bra to prevent discomfort from breast tenderness.  If you develop varicose veins: ? Wear support pantyhose or compression stockings as told by your healthcare provider. ? Elevate your feet for 15 minutes, 3-4 times a day. Prenatal care  Write down your questions. Take them to your prenatal visits.  Keep all your prenatal visits as told by your health care provider. This is important. Safety  Wear your seat belt at all times when driving.  Make  a list of emergency phone numbers, including numbers for family, friends, the hospital, and police and fire departments. General instructions  Avoid cat litter boxes and soil used by cats. These carry germs that can cause birth defects in the baby. If you have a cat, ask someone to clean the litter box for you.  Do not travel far distances unless it is absolutely necessary and only with the approval of your health care provider.  Do not use hot tubs, steam rooms, or saunas.  Do not drink alcohol.  Do not use any products that contain nicotine or tobacco, such as cigarettes and e-cigarettes. If you need help quitting, ask your health care provider.  Do not use any medicinal herbs or unprescribed drugs. These chemicals affect the formation and growth of the baby.  Do not douche or use tampons or scented sanitary pads.  Do not cross your legs for long periods of time.  To prepare for the arrival of your baby: ? Take prenatal classes to understand, practice, and ask questions about labor and delivery. ? Make a trial run to the hospital. ? Visit the hospital and tour the maternity area. ? Arrange for maternity or paternity leave through employers. ? Arrange for family and friends to take care of pets while you are in the hospital. ? Purchase a rear-facing car seat and make sure you know how to install it in your car. ? Pack your hospital bag. ? Prepare the baby's nursery. Make sure to remove all pillows and stuffed animals from the baby's crib to prevent suffocation.  Visit your dentist if you have not gone during your pregnancy. Use a soft toothbrush to brush your teeth and be gentle when you floss. Contact a health care provider if:  You are unsure if you are in labor or if your water has broken.  You become dizzy.  You have mild pelvic cramps, pelvic pressure, or nagging pain in your abdominal area.  You have lower back pain.  You have persistent nausea, vomiting, or  diarrhea.  You have an unusual or bad smelling vaginal discharge.  You have pain when you urinate. Get help right away if:  Your water breaks before 37 weeks.  You have regular contractions less than 5 minutes apart before 37 weeks.  You have a fever.  You are leaking fluid from your vagina.  You have spotting or bleeding from your vagina.  You have severe abdominal pain or cramping.  You have rapid weight loss or weight gain.  You have   shortness of breath with chest pain.  You notice sudden or extreme swelling of your face, hands, ankles, feet, or legs.  Your baby makes fewer than 10 movements in 2 hours.  You have severe headaches that do not go away when you take medicine.  You have vision changes. Summary  The third trimester is from week 28 through week 40, months 7 through 9. The third trimester is a time when the unborn baby (fetus) is growing rapidly.  During the third trimester, your discomfort may increase as you and your baby continue to gain weight. You may have abdominal, leg, and back pain, sleeping problems, and an increased need to urinate.  During the third trimester your breasts will keep growing and they will continue to become tender. A yellow fluid (colostrum) may leak from your breasts. This is the first milk you are producing for your baby.  False labor is a condition in which you feel small, irregular tightenings of the muscles in the womb (contractions) that eventually go away. These are called Braxton Hicks contractions. Contractions may last for hours, days, or even weeks before true labor sets in.  Signs of labor can include: abdominal cramps; regular contractions that start at 10 minutes apart and become stronger and more frequent with time; watery or bloody mucus discharge that comes from the vagina; increased pelvic pressure and dull back pain; and leaking of amniotic fluid. This information is not intended to replace advice given to you by your  health care provider. Make sure you discuss any questions you have with your health care provider. Document Released: 12/17/2000 Document Revised: 01/29/2016 Document Reviewed: 01/29/2016 Elsevier Interactive Patient Education  2019 [employer]  

## 2018-05-13 NOTE — Progress Notes (Signed)
Virtual Visit via Telephone Note  I connected with patient on 05/13/18 at  8:20 AM EDT by telephone and verified that I am speaking with the correct person using two identifiers.   I discussed the limitations, risks, security and privacy concerns of performing an evaluation and management service by telephone and the availability of in person appointments. I also discussed with the patient that there may be a patient responsible charge related to this service. The patient expressed understanding and agreed to proceed.  The patient was at home I spoke with the patient from my  office  Jeanette [name] is a 28 y.o. G1P0 at [redacted]w[redacted]d being seen today for ongoing prenatal care.  She is currently monitored for the following issues for this low-risk pregnancy and has Recurrent UTI and Supervision of normal first pregnancy on their problem list.  ----------------------------------------------------------------------------------- Patient reports no complaints.   Denies pain, VB, leaking of fluid.  ----------------------------------------------------------------------------------- The following portions of the patient's history were reviewed and updated as appropriate: allergies, current medications, past family history, past medical history, past social history, past surgical history and problem list. Problem list updated.   Objective  Weight 152 lb (68.9 kg), last menstrual period 11/16/2017. Pregravid weight 118 lb (53.5 kg) Total Weight Gain 34 lb (15.4 kg)  Physical Exam could not be performed. Because of the COVID-19 outbreak this visit was performed over the phone and not in person.   Assessment   28 y.o. G1P0 at [redacted]w[redacted]d by  08/23/2018, by Last Menstrual Period presenting for routine prenatal visit  Plan   Pregnancy #1 Problems (from 11/16/17 to present)    Problem Noted Resolved   Supervision of normal first pregnancy 01/18/2018 by Homero Fellers, MD No   Overview Addendum 05/13/2018  8:37 AM  by Gae Dry, MD      [facility]inic [facility] Prenatal Labs  Dating  LMP =8wk [place] Blood type: A/Negative/-- (01/10 0945)   Genetic Screen none Antibody:Negative (01/10 0945)  Anatomic [place] Normal anatomy, anterior placenta Rubella: 7.43 (01/10 0945)  Varicella: Immune  GTT 28 wk:      RPR: Non Reactive (01/10 0945)   Rhogam Planned 28 weeks HBsAg: Negative (01/10 0945)   TDaP vaccine                       HIV: Non Reactive (01/10 0945)   Flu Shot   09/2017                             GBS:   Contraception  Pap: NIL 2020  CBB     CS/VBAC n/a   Baby Food  Breast   Support Person               PNV Fetal movements discussed Cor[place] virus implications and precautions discussed  Gestational age appropriate obstetric precautions including but not limited to vaginal bleeding, contractions, leaking of fluid and fetal movement were reviewed in detail with the patient.     Follow Up Instructions: 3 weeks, w labs and Rhogam needed   I discussed the assessment and treatment plan with the patient. The patient was provided an opportunity to ask questions and all were answered. The patient agreed with the plan and demonstrated an understanding of the instructions.   The patient was advised to call back or seek an in-person evaluation if the symptoms worsen or if the condition fails to improve as anticipated.  I  provided 6 minutes of non-face-to-face time during this encounter.  Return in about 3 weeks (around 06/03/2018) for ROB w glucola and Rhogam in office.  Jeanette Applebaum, MD [facility], [facility] 05/13/2018 8:44 AM

## 2018-06-04 ENCOUNTER — Other Ambulatory Visit: Payer: Self-pay

## 2018-06-04 ENCOUNTER — Other Ambulatory Visit: Payer: 59

## 2018-06-04 ENCOUNTER — Ambulatory Visit (INDEPENDENT_AMBULATORY_CARE_PROVIDER_SITE_OTHER): Payer: 59 | Admitting: Maternal Newborn

## 2018-06-04 ENCOUNTER — Encounter: Payer: Self-pay | Admitting: Maternal Newborn

## 2018-06-04 VITALS — BP 110/64 | Wt 156.6 lb

## 2018-06-04 DIAGNOSIS — Z3400 Encounter for supervision of normal first pregnancy, unspecified trimester: Secondary | ICD-10-CM

## 2018-06-04 DIAGNOSIS — Z3A28 28 weeks gestation of pregnancy: Secondary | ICD-10-CM

## 2018-06-04 DIAGNOSIS — Z3403 Encounter for supervision of normal first pregnancy, third trimester: Secondary | ICD-10-CM

## 2018-06-04 DIAGNOSIS — Z131 Encounter for screening for diabetes mellitus: Secondary | ICD-10-CM

## 2018-06-04 LAB — POCT URINALYSIS DIPSTICK OB
Glucose, UA: NEGATIVE
POC,PROTEIN,UA: NEGATIVE

## 2018-06-04 MED ORDER — RHO D IMMUNE GLOBULIN 1500 UNIT/2ML IJ SOSY
300.0000 ug | PREFILLED_SYRINGE | Freq: Once | INTRAMUSCULAR | Status: AC
  Administered 2018-06-04: 09:00:00 300 ug via INTRAMUSCULAR

## 2018-06-04 NOTE — Progress Notes (Signed)
    Routine Prenatal Care Visit  Subjective  Jeanette [name] is a 28 y.o. G1P0 at [redacted]w[redacted]d being seen today for ongoing prenatal care.  She is currently monitored for the following issues for this low-risk pregnancy and has Recurrent UTI and Supervision of normal first pregnancy on their problem list.  ----------------------------------------------------------------------------------- Patient reports no complaints. Lots of baby movements. Contractions: Not present. Vag. Bleeding: None.  Movement: Present. No leaking of fluid.  ----------------------------------------------------------------------------------- The following portions of the patient's history were reviewed and updated as appropriate: allergies, current medications, past family history, past medical history, past social history, past surgical history and problem list. Problem list updated.  Objective  Blood pressure 110/64, weight 156 lb 9.6 oz (71 kg), last menstrual period 11/16/2017. Pregravid weight 118 lb (53.5 kg) Total Weight Gain 38 lb 9.6 oz (17.5 kg) Urinalysis: Urine dipstick shows negative for glucose, protein. Fetal Status: Fetal Heart Rate (bpm): 145 Fundal Height: 27 cm Movement: Present     General:  Alert, oriented and cooperative. Patient is in no acute distress.  Skin: Skin is warm and dry. No rash noted.   Cardiovascular: Normal heart rate noted  Respiratory: Normal respiratory effort, no problems with respiration noted  Abdomen: Soft, gravid, appropriate for gestational age. Pain/Pressure: Absent     Pelvic:  Cervical exam deferred        Extremities: Normal range of motion.  Edema: None  Mental Status: Normal mood and affect. Normal behavior. Normal judgment and thought content.     Assessment   28 y.o. G1P0 at [redacted]w[redacted]d, EDD 08/23/2018 by Last Menstrual Period presenting for a routine prenatal visit.  Plan   Pregnancy #1 Problems (from 11/16/17 to present)    Problem Noted Resolved   Supervision of normal  first pregnancy 01/18/2018 by Homero Fellers, MD No   Overview Addendum 05/13/2018  8:37 AM by Gae Dry, MD      [facility]inic [facility] Prenatal Labs  Dating  LMP =8wk [place] Blood type: A/Negative/-- (01/10 0945)   Genetic Screen none Antibody:Negative (01/10 0945)  Anatomic [place] Normal anatomy, anterior placenta Rubella: 7.43 (01/10 0945)  Varicella: Immune  GTT 28 wk:      RPR: Non Reactive (01/10 0945)   Rhogam Planned 28 weeks HBsAg: Negative (01/10 0945)   TDaP vaccine                       HIV: Non Reactive (01/10 0945)   Flu Shot   09/2017                             GBS:   Contraception  Pap: NIL 2020  CBB     CS/VBAC n/a   Baby Food  Breast   Support Person              GTT/labs and RhoGAM today   Please refer to After Visit Summary for other counseling recommendations.   Return in about 2 weeks (around 06/18/2018) for ROB.  Avel Sensor, CNM 06/04/2018  8:42 AM

## 2018-06-04 NOTE — Patient Instructions (Signed)
Third Trimester of Pregnancy The third trimester is from week 28 through week 40 (months 7 through 9). The third trimester is a time when the unborn baby (fetus) is growing rapidly. At the end of the ninth month, the fetus is about 20 inches in length and weighs 6-10 pounds. Body changes during your third trimester Your body will continue to go through many changes during pregnancy. The changes vary from woman to woman. During the third trimester:  Your weight will continue to increase. You can expect to gain 25-35 pounds (11-16 kg) by the end of the pregnancy.  You may begin to get stretch marks on your hips, abdomen, and breasts.  You may urinate more often because the fetus is moving lower into your pelvis and pressing on your bladder.  You may develop or continue to have heartburn. This is caused by increased hormones that slow down muscles in the digestive tract.  You may develop or continue to have constipation because increased hormones slow digestion and cause the muscles that push waste through your intestines to relax.  You may develop hemorrhoids. These are swollen veins (varicose veins) in the rectum that can itch or be painful.  You may develop swollen, bulging veins (varicose veins) in your legs.  You may have increased body aches in the pelvis, back, or thighs. This is due to weight gain and increased hormones that are relaxing your joints.  You may have changes in your hair. These can include thickening of your hair, rapid growth, and changes in texture. Some women also have hair loss during or after pregnancy, or hair that feels dry or thin. Your hair will most likely return to normal after your baby is born.  Your breasts will continue to grow and they will continue to become tender. A yellow fluid (colostrum) may leak from your breasts. This is the first milk you are producing for your baby.  Your belly button may stick out.  You may notice more swelling in your hands,  face, or ankles.  You may have increased tingling or numbness in your hands, arms, and legs. The skin on your belly may also feel numb.  You may feel short of breath because of your expanding uterus.  You may have more problems sleeping. This can be caused by the size of your belly, increased need to urinate, and an increase in your body's metabolism.  You may notice the fetus "dropping," or moving lower in your abdomen (lightening).  You may have increased vaginal discharge.  You may notice your joints feel loose and you may have pain around your pelvic bone. What to expect at prenatal visits You will have prenatal exams every 2 weeks until week 36. Then you will have weekly prenatal exams. During a routine prenatal visit:  You will be weighed to make sure you and the baby are growing normally.  Your blood pressure will be taken.  Your abdomen will be measured to track your baby's growth.  The fetal heartbeat will be listened to.  Any test results from the previous visit will be discussed.  You may have a cervical check near your due date to see if your cervix has softened or thinned (effaced).  You will be tested for Group B streptococcus. This happens between 35 and 37 weeks. Your health care provider may ask you:  What your birth plan is.  How you are feeling.  If you are feeling the baby move.  If you have had any abnormal   symptoms, such as leaking fluid, bleeding, severe headaches, or abdominal cramping.  If you are using any tobacco products, including cigarettes, chewing tobacco, and electronic cigarettes.  If you have any questions. Other tests or screenings that may be performed during your third trimester include:  Blood tests that check for low iron levels (anemia).  Fetal testing to check the health, activity level, and growth of the fetus. Testing is done if you have certain medical conditions or if there are problems during the pregnancy.  Nonstress test  (NST). This test checks the health of your baby to make sure there are no signs of problems, such as the baby not getting enough oxygen. During this test, a belt is placed around your belly. The baby is made to move, and its heart rate is monitored during movement. What is false labor? False labor is a condition in which you feel small, irregular tightenings of the muscles in the womb (contractions) that usually go away with rest, changing position, or drinking water. These are called Braxton Hicks contractions. Contractions may last for hours, days, or even weeks before true labor sets in. If contractions come at regular intervals, become more frequent, increase in intensity, or become painful, you should see your health care provider. What are the signs of labor?  Abdominal cramps.  Regular contractions that start at 10 minutes apart and become stronger and more frequent with time.  Contractions that start on the top of the uterus and spread down to the lower abdomen and back.  Increased pelvic pressure and dull back pain.  A watery or bloody mucus discharge that comes from the vagina.  Leaking of amniotic fluid. This is also known as your "water breaking." It could be a slow trickle or a gush. Let your health care provider know if it has a color or strange odor. If you have any of these signs, call your health care provider right away, even if it is before your due date. Follow these instructions at home: Medicines  Follow your health care provider's instructions regarding medicine use. Specific medicines may be either safe or unsafe to take during pregnancy.  Take a prenatal vitamin that contains at least 600 micrograms (mcg) of folic acid.  If you develop constipation, try taking a stool softener if your health care provider approves. Eating and drinking   Eat a balanced diet that includes fresh fruits and vegetables, whole grains, good sources of protein such as meat, eggs, or tofu,  and low-fat dairy. Your health care provider will help you determine the amount of weight gain that is right for you.  Avoid raw meat and uncooked cheese. These carry germs that can cause birth defects in the baby.  If you have low calcium intake from food, talk to your health care provider about whether you should take a daily calcium supplement.  Eat four or five small meals rather than three large meals a day.  Limit foods that are high in fat and processed sugars, such as fried and sweet foods.  To prevent constipation: ? Drink enough fluid to keep your urine clear or pale yellow. ? Eat foods that are high in fiber, such as fresh fruits and vegetables, whole grains, and beans. Activity  Exercise only as directed by your health care provider. Most women can continue their usual exercise routine during pregnancy. Try to exercise for 30 minutes at least 5 days a week. Stop exercising if you experience uterine contractions.  Avoid heavy lifting.  Do   not exercise in extreme heat or humidity, or at high altitudes.  Wear low-heel, comfortable shoes.  Practice good posture.  You may continue to have sex unless your health care provider tells you otherwise. Relieving pain and discomfort  Take frequent breaks and rest with your legs elevated if you have leg cramps or low back pain.  Take warm sitz baths to soothe any pain or discomfort caused by hemorrhoids. Use hemorrhoid cream if your health care provider approves.  Wear a good support bra to prevent discomfort from breast tenderness.  If you develop varicose veins: ? Wear support pantyhose or compression stockings as told by your healthcare provider. ? Elevate your feet for 15 minutes, 3-4 times a day. Prenatal care  Write down your questions. Take them to your prenatal visits.  Keep all your prenatal visits as told by your health care provider. This is important. Safety  Wear your seat belt at all times when driving.  Make  a list of emergency phone numbers, including numbers for family, friends, the hospital, and police and fire departments. General instructions  Avoid cat litter boxes and soil used by cats. These carry germs that can cause birth defects in the baby. If you have a cat, ask someone to clean the litter box for you.  Do not travel far distances unless it is absolutely necessary and only with the approval of your health care provider.  Do not use hot tubs, steam rooms, or saunas.  Do not drink alcohol.  Do not use any products that contain nicotine or tobacco, such as cigarettes and e-cigarettes. If you need help quitting, ask your health care provider.  Do not use any medicinal herbs or unprescribed drugs. These chemicals affect the formation and growth of the baby.  Do not douche or use tampons or scented sanitary pads.  Do not cross your legs for long periods of time.  To prepare for the arrival of your baby: ? Take prenatal classes to understand, practice, and ask questions about labor and delivery. ? Make a trial run to the hospital. ? Visit the hospital and tour the maternity area. ? Arrange for maternity or paternity leave through employers. ? Arrange for family and friends to take care of pets while you are in the hospital. ? Purchase a rear-facing car seat and make sure you know how to install it in your car. ? Pack your hospital bag. ? Prepare the baby's nursery. Make sure to remove all pillows and stuffed animals from the baby's crib to prevent suffocation.  Visit your dentist if you have not gone during your pregnancy. Use a soft toothbrush to brush your teeth and be gentle when you floss. Contact a health care provider if:  You are unsure if you are in labor or if your water has broken.  You become dizzy.  You have mild pelvic cramps, pelvic pressure, or nagging pain in your abdominal area.  You have lower back pain.  You have persistent nausea, vomiting, or  diarrhea.  You have an unusual or bad smelling vaginal discharge.  You have pain when you urinate. Get help right away if:  Your water breaks before 37 weeks.  You have regular contractions less than 5 minutes apart before 37 weeks.  You have a fever.  You are leaking fluid from your vagina.  You have spotting or bleeding from your vagina.  You have severe abdominal pain or cramping.  You have rapid weight loss or weight gain.  You have   shortness of breath with chest pain.  You notice sudden or extreme swelling of your face, hands, ankles, feet, or legs.  Your baby makes fewer than 10 movements in 2 hours.  You have severe headaches that do not go away when you take medicine.  You have vision changes. Summary  The third trimester is from week 28 through week 40, months 7 through 9. The third trimester is a time when the unborn baby (fetus) is growing rapidly.  During the third trimester, your discomfort may increase as you and your baby continue to gain weight. You may have abdominal, leg, and back pain, sleeping problems, and an increased need to urinate.  During the third trimester your breasts will keep growing and they will continue to become tender. A yellow fluid (colostrum) may leak from your breasts. This is the first milk you are producing for your baby.  False labor is a condition in which you feel small, irregular tightenings of the muscles in the womb (contractions) that eventually go away. These are called Braxton Hicks contractions. Contractions may last for hours, days, or even weeks before true labor sets in.  Signs of labor can include: abdominal cramps; regular contractions that start at 10 minutes apart and become stronger and more frequent with time; watery or bloody mucus discharge that comes from the vagina; increased pelvic pressure and dull back pain; and leaking of amniotic fluid. This information is not intended to replace advice given to you by your  health care provider. Make sure you discuss any questions you have with your health care provider. Document Released: 12/17/2000 Document Revised: 01/29/2016 Document Reviewed: 01/29/2016 Elsevier Interactive Patient Education  2019 [employer]  

## 2018-06-04 NOTE — Progress Notes (Signed)
ROB/1 hr GTT/Rhogham- no concerns

## 2018-06-05 LAB — 28 WEEKS RH-PANEL
Antibody Screen: NEGATIVE
Basophils Absolute: 0 10*3/uL (ref 0.0–0.2)
Basos: 0 %
EOS (ABSOLUTE): 0.1 10*3/uL (ref 0.0–0.4)
Eos: 1 %
Gestational Diabetes Screen: 82 mg/dL (ref 65–139)
HIV Screen 4th Generation wRfx: NONREACTIVE
Hematocrit: 33.5 % — ABNORMAL LOW (ref 34.0–46.6)
Hemoglobin: 10.9 g/dL — ABNORMAL LOW (ref 11.1–15.9)
Immature Grans (Abs): 0 10*3/uL (ref 0.0–0.1)
Immature Granulocytes: 0 %
Lymphocytes Absolute: 1.4 10*3/uL (ref 0.7–3.1)
Lymphs: 15 %
MCH: 30.2 pg (ref 26.6–33.0)
MCHC: 32.5 g/dL (ref 31.5–35.7)
MCV: 93 fL (ref 79–97)
Monocytes Absolute: 0.5 10*3/uL (ref 0.1–0.9)
Monocytes: 5 %
Neutrophils Absolute: 7.6 10*3/uL — ABNORMAL HIGH (ref 1.4–7.0)
Neutrophils: 79 %
Platelets: 186 10*3/uL (ref 150–450)
RBC: 3.61 x10E6/uL — ABNORMAL LOW (ref 3.77–5.28)
RDW: 12.4 % (ref 11.7–15.4)
RPR Ser Ql: NONREACTIVE
WBC: 9.7 10*3/uL (ref 3.4–10.8)

## 2018-06-18 ENCOUNTER — Other Ambulatory Visit: Payer: Self-pay

## 2018-06-18 ENCOUNTER — Ambulatory Visit (INDEPENDENT_AMBULATORY_CARE_PROVIDER_SITE_OTHER): Payer: 59 | Admitting: Obstetrics and Gynecology

## 2018-06-18 ENCOUNTER — Encounter: Payer: Self-pay | Admitting: Obstetrics and Gynecology

## 2018-06-18 VITALS — BP 112/52 | Wt 158.0 lb

## 2018-06-18 DIAGNOSIS — Z3A30 30 weeks gestation of pregnancy: Secondary | ICD-10-CM

## 2018-06-18 DIAGNOSIS — Z23 Encounter for immunization: Secondary | ICD-10-CM | POA: Diagnosis not present

## 2018-06-18 DIAGNOSIS — O99013 Anemia complicating pregnancy, third trimester: Secondary | ICD-10-CM

## 2018-06-18 DIAGNOSIS — Z3403 Encounter for supervision of normal first pregnancy, third trimester: Secondary | ICD-10-CM

## 2018-06-18 NOTE — Patient Instructions (Addendum)
COVID-19 and Your Pregnancy FAQ  How can I prevent infection with COVID-19 during my pregnancy? Social distancing is key. Please limit any interactions in public. Try and work from home if possible. Frequently wash your hands after touching possibly contaminated surfaces. Avoid touching your face.  Minimize trips to the store. Consider online ordering when possible.   Should I wear a mask? YES. It is recommended by the CDC that all people wear a cloth mask or facial covering in public. You should wear a mask to your visits in the office. This will help reduce transmission as well as your risk or acquiring COVID-19. New studies are showing that even asymptomatic individuals can spread the virus from talking.   Where can I get a mask? [place] and the city of [place] are partnering to provide masks to community members. You can pick up a mask from several locations. This website also has instructions about how to make a mask by sewing or without sewing by using a t-shirt or bandana.  https://www.-[place].gov/i-want-to/learn-about/covid-19-information-and-updates/covid-19-face-mask-project  Studies have shown that if you were a tube or nylon stocking from pantyhose over a cloth mask it makes the cloth mask almost as effective as a N95 mask.  https://www.davis-walter.com/  What are the symptoms of COVID-19? Fever (greater than 100.4 F), dry cough, shortness of breath.  Am I more at risk for COVID-19 since I am pregnant? There is not currently data showing that pregnant women are more adversely impacted by COVID-19 than the general population. However, we know that pregnant women tend to have worse respiratory complications from similar diseases such as the flu and SARS and for this reason should be considered an at-risk population.  What do I do if I am experiencing the  symptoms of COVID-19? Testing is being limited because of test availability. If you are experiencing symptoms you should quarantine yourself, and the members of your family, for at least 2 weeks at home.   Please visit this website for more information: [email or web address].co.za.html  When should I go to the Emergency Room? Please go to the emergency room if you are experiencing ANY of these symptoms*:  1.    Difficulty breathing or shortness of breath 2.    Persistent pain or pressure in the chest 3.    Confusion or difficulty being aroused (or awakened) 4.    Bluish lips or face  *This list is not all inclusive. Please consult our office for any other symptoms that are severe or concerning.  What do I do if I am having difficulty breathing? You should go to the Emergency Room for evaluation. At this time they have a tent set up for evaluating patients with COVID-19 symptoms.   How will my prenatal care be different because of the COVID-19 pandemic? It has been recommended to reduce the frequency of face-to-face visits and use resources such as telephone and virtual visits when possible. Using a scale, blood pressure machine and fetal doppler at home can further help reduce face-to-face visits. You will be provided with additional information on this topic.  We ask that you come to your visits alone to minimize potential exposures to  COVID-19.  How can I receive childbirth education? At this time in-person classes have been cancelled. You can register for online childbirth education, breastfeeding, and newborn care classes.  Please visit:  [email or web address] for more information  How will my hospital birth experience be different? The hospital is currently limiting visitors. This means that while you are in  labor you can only have one person at the hospital with you. Additional family members will not be allowed to wait in the  building or outside your room. Your one support person can be the father of the baby, a relative, a doula, or a friend. Once one support person is designated that person will wear a band. This band cannot be shared with multiple people.  Nitrous Gas is not being offered for pain relief since the tubing and filter for the machine can not be sanitized in a way to guarantee prevention of transmission of COVID-19.  Nasal cannula use of oxygen for fetal indications has also been discontinued.  Currently a clear plastic sheet is being hung between mom and the delivering provider during pushing and delivery to help prevent transmission of COVID-19.      How long will I stay in the hospital for after giving birth? It is also recommended that discharge home be expedited during the COVID-19 outbreak. This means staying for 1 day after a vaginal delivery and 2 days after a cesarean section. Patients who need to stay longer for medical reasons are allowed to do so, but the goal will be for expedited discharge home.   What if I have COVID-19 and I am in labor? We ask that you wear a mask while on labor and delivery. We will try and accommodate you being placed in a room that is capable of filtering the air. Please call ahead if you are in labor and on your way to the hospital. The phone number for labor and delivery at [facility] is [phone].  If I have COVID-19 when my baby is born how can I prevent my baby from contracting COVID-19? This is an issue that will have to be discussed on a case-by-case basis. Current recommendations suggest providing separate isolation rooms for both the mother and new infant as well as limiting visitors. However, there are practical challenges to this recommendation. The situation will assuredly change and decisions will be influenced by the desires of the mother and availability of space.  Some suggestions are the use of a curtain or physical barrier  between mom and infant, hand hygiene, mom wearing a mask, or 6 feet of spacing between a mom and infant.   Can I breastfeed during the COVID-19 pandemic?   Yes, breastfeeding is encouraged.  Can I breastfeed if I have COVID-19? Yes. Covid-19 has not been found in breast milk. This means you cannot give COVID-19 to your child through breast milk. Breast feeding will also help pass antibodies to fight infection to your baby.   What precautions should I take when breastfeeding if I have COVID-19? If a mother and newborn do room-in and the mother wishes to feed at the breast, she should put on a facemask and practice hand hygiene before each feeding.  What precautions should I take when pumping if I have COVID-19? Prior to expressing breast milk, mothers should practice hand hygiene. After each pumping session, all parts that come into contact with breast milk should be thoroughly washed and the entire pump should be appropriately disinfected per the manufacturers instructions. This expressed breast milk should be fed to the newborn by a healthy caregiver.  What if I am pregnant and work in healthcare? Based on limited data regarding COVID-19 and pregnancy, ACOG currently does not propose creating additional restrictions on pregnant health care personnel because of COVID-19 alone. Pregnant women do not appear to be at higher  risk of severe disease related to COVID-19. Pregnant health care personnel should follow CDC risk assessment and infection control guidelines for health care personnel exposed to patients with suspected or confirmed COVID-19. Adherence to recommended infection prevention and control practices is an important part of protecting all health care personnel in health care settings.    Information on COVID-19 in pregnancy is very limited; however, facilities may want to consider limiting exposure of pregnant health care personnel to patients with confirmed or suspected COVID-19  infection, especially during higher-risk procedures (eg, aerosol-generating procedures), if feasible, based on staffing availability.    Fetal Movement Counts Patient Name: ________________________________________________ Patient Due Date: ____________________ What is a fetal movement count?  A fetal movement count is the number of times that you feel your baby move during a certain amount of time. This may also be called a fetal kick count. A fetal movement count is recommended for every pregnant woman. You may be asked to start counting fetal movements as early as week 28 of your pregnancy. Pay attention to when your baby is most active. You may notice your baby's sleep and wake cycles. You may also notice things that make your baby move more. You should do a fetal movement count:  When your baby is normally most active.  At the same time each day. A good time to count movements is while you are resting, after having something to eat and drink. How do I count fetal movements? 1. Find a quiet, comfortable area. Sit, or lie down on your side. 2. Write down the date, the start time and stop time, and the number of movements that you felt between those two times. Take this information with you to your health care visits. 3. For 2 hours, count kicks, flutters, swishes, rolls, and jabs. You should feel at least 10 movements during 2 hours. 4. You may stop counting after you have felt 10 movements. 5. If you do not feel 10 movements in 2 hours, have something to eat and drink. Then, keep resting and counting for 1 hour. If you feel at least 4 movements during that hour, you may stop counting. Contact a health care provider if:  You feel fewer than 4 movements in 2 hours.  Your baby is not moving like he or she usually does. Date: ____________ Start time: ____________ Stop time: ____________ Movements: ____________ Date: ____________ Start time: ____________ Stop time: ____________ Movements:  ____________ Date: ____________ Start time: ____________ Stop time: ____________ Movements: ____________ Date: ____________ Start time: ____________ Stop time: ____________ Movements: ____________ Date: ____________ Start time: ____________ Stop time: ____________ Movements: ____________ Date: ____________ Start time: ____________ Stop time: ____________ Movements: ____________ Date: ____________ Start time: ____________ Stop time: ____________ Movements: ____________ Date: ____________ Start time: ____________ Stop time: ____________ Movements: ____________ Date: ____________ Start time: ____________ Stop time: ____________ Movements: ____________ This information is not intended to replace advice given to you by your health care provider. Make sure you discuss any questions you have with your health care provider. Document Released: 01/22/2006 Document Revised: 08/22/2015 Document Reviewed: 02/01/2015 Elsevier Interactive Patient Education  2019 [employer].   Perception of least 10 fetal movements (FMs) over up to two hours when the mother is at rest and focused on counting ("count to 10" method). ?Perception of at least 10 FMs during 12 hours of normal maternal activity. ?Perception of at least 4 FMs in one hour when the mother is at rest and focused on counting. ?Perception of at least 10 FMs  within 25 minutes in pregnancies 22 to 36 weeks and 35 minutes in pregnancies 37 or more weeks of gestation.

## 2018-06-18 NOTE — Progress Notes (Signed)
    Routine Prenatal Care Visit  Subjective  Jeanette [name] is a 28 y.o. G1P0 at [redacted]w[redacted]d being seen today for ongoing prenatal care.  She is currently monitored for the following issues for this low-risk pregnancy and has Recurrent UTI and Supervision of normal first pregnancy on their problem list.  ----------------------------------------------------------------------------------- Patient reports fatigue.   Contractions: Not present. Vag. Bleeding: None.  Movement: Present. Denies leaking of fluid.  ----------------------------------------------------------------------------------- The following portions of the patient's history were reviewed and updated as appropriate: allergies, current medications, past family history, past medical history, past social history, past surgical history and problem list. Problem list updated.   Objective  Blood pressure (!) 112/52, weight 158 lb (71.7 kg), last menstrual period 11/16/2017. Pregravid weight 118 lb (53.5 kg) Total Weight Gain 40 lb (18.1 kg) Urinalysis:      Fetal Status: Fetal Heart Rate (bpm): 160 Fundal Height: 28 cm Movement: Present     General:  Alert, oriented and cooperative. Patient is in no acute distress.  Skin: Skin is warm and dry. No rash noted.   Cardiovascular: Normal heart rate noted  Respiratory: Normal respiratory effort, no problems with respiration noted  Abdomen: Soft, gravid, appropriate for gestational age. Pain/Pressure: Absent     Pelvic:  Cervical exam deferred        Extremities: Normal range of motion.  Edema: None  Mental Status: Normal mood and affect. Normal behavior. Normal judgment and thought content.     Assessment   28 y.o. G1P0 at [redacted]w[redacted]d by  08/23/2018, by Last Menstrual Period presenting for routine prenatal visit  Plan   Pregnancy #1 Problems (from 11/16/17 to present)    Problem Noted Resolved   Supervision of normal first pregnancy 01/18/2018 by Homero Fellers, MD No   Overview Addendum  06/18/2018  8:39 AM by Homero Fellers, MD      Clinic [facility] Prenatal Labs  Dating  LMP =8wk [place] Blood type: A/Negative/-- (01/10 0945)   Genetic Screen none Antibody:Negative (01/10 0945)  Anatomic [place] Normal anatomy, anterior placenta Rubella: 7.43 (01/10 0945)  Varicella: Immune  GTT 28 wk: 82     RPR: Non Reactive (01/10 0945)   Rhogam Planned 28 weeks HBsAg: Negative (01/10 0945)   TDaP vaccine  06/18/2018                      HIV: Non Reactive (01/10 0945)   Flu Shot   09/2017                             GBS:   Contraception  Pap: NIL 2020  CBB     CS/VBAC n/a   Baby Food  Breast   Support Person                 Gestational age appropriate obstetric precautions including but not limited to vaginal bleeding, contractions, leaking of fluid and fetal movement were reviewed in detail with the patient.    Anemia and fatigue, increase oral iron therapy, labs today- repeat at 36 weeks.  Discussed fetal kick counts  Return in about 2 weeks (around 07/02/2018) for telephone visit.  Homero Fellers MD [facility], [facility] 06/18/2018, 9:19 AM

## 2018-06-18 NOTE — Progress Notes (Signed)
ROB Fatigue Denies lof, no vb, FM is inconsistent  Tdap/BT consent today

## 2018-06-19 LAB — VITAMIN B12: Vitamin B-12: 286 pg/mL (ref 232–1245)

## 2018-06-19 LAB — IRON AND TIBC
Iron Saturation: 18 % (ref 15–55)
Iron: 73 ug/dL (ref 27–159)
Total Iron Binding Capacity: 416 ug/dL (ref 250–450)
UIBC: 343 ug/dL (ref 131–425)

## 2018-06-19 LAB — FERRITIN: Ferritin: 34 ng/mL (ref 15–150)

## 2018-06-19 LAB — IRON: Iron: 72 ug/dL (ref 27–159)

## 2018-06-23 NOTE — Progress Notes (Signed)
Note sent to mychart for B12 supplementation

## 2018-06-30 ENCOUNTER — Encounter: Payer: Self-pay | Admitting: Obstetrics and Gynecology

## 2018-06-30 ENCOUNTER — Other Ambulatory Visit: Payer: Self-pay

## 2018-06-30 ENCOUNTER — Ambulatory Visit (INDEPENDENT_AMBULATORY_CARE_PROVIDER_SITE_OTHER): Payer: 59 | Admitting: Obstetrics and Gynecology

## 2018-06-30 VITALS — Wt 160.0 lb

## 2018-06-30 DIAGNOSIS — Z3A32 32 weeks gestation of pregnancy: Secondary | ICD-10-CM

## 2018-06-30 DIAGNOSIS — Z3403 Encounter for supervision of normal first pregnancy, third trimester: Secondary | ICD-10-CM

## 2018-06-30 DIAGNOSIS — O26843 Uterine size-date discrepancy, third trimester: Secondary | ICD-10-CM

## 2018-06-30 NOTE — Progress Notes (Signed)
ROB No concerns  Denies lof, no vb Good FM

## 2018-06-30 NOTE — Progress Notes (Signed)
Routine Prenatal Care Visit- Virtual Visit  Subjective   Virtual Visit via Telephone Note  I connected with Jeanette [name] on 06/30/18 at  8:10 AM EDT by telephone and verified that I am speaking with the correct person using two identifiers.   I discussed the limitations, risks, security and privacy concerns of performing an evaluation and management service by telephone and the availability of in person appointments. I also discussed with the patient that there may be a patient responsible charge related to this service. The patient expressed understanding and agreed to proceed.  The patient was at home I spoke with the patient from my  office The names of people involved in this encounter were: Jarrett Soho Garretson and Dr. Gilman Schmidt.   Jeanette [name] is a 28 y.o. G1P0 at [redacted]w[redacted]d being seen today for ongoing prenatal care.  She is currently monitored for the following issues for this low-risk pregnancy and has Recurrent UTI and Supervision of normal first pregnancy on their problem list.  ----------------------------------------------------------------------------------- Patient reports hip pain.   Contractions: Irregular. Vag. Bleeding: None.  Movement: Present. Denies leaking of fluid.  ----------------------------------------------------------------------------------- The following portions of the patient's history were reviewed and updated as appropriate: allergies, current medications, past family history, past medical history, past social history, past surgical history and problem list. Problem list updated.   Objective  Last menstrual period 11/16/2017. Pregravid weight 118 lb (53.5 kg) Total Weight Gain 42 lb (19.1 kg) Urinalysis:      Fetal Status:     Movement: Present     Physical Exam could not be performed. Because of the COVID-19 outbreak this visit was performed over the phone and not in person.   Assessment   28 y.o. G1P0 at [redacted]w[redacted]d by  08/23/2018, by Last Menstrual Period  presenting for routine prenatal visit  Plan   Pregnancy #1 Problems (from 11/16/17 to present)    Problem Noted Resolved   Supervision of normal first pregnancy 01/18/2018 by Homero Fellers, MD No   Overview Addendum 06/18/2018  8:39 AM by Homero Fellers, MD      [facility]inic [facility] Prenatal Labs  Dating  LMP =8wk [place] Blood type: A/Negative/-- (01/10 0945)   Genetic Screen none Antibody:Negative (01/10 0945)  Anatomic [place] Normal anatomy, anterior placenta Rubella: 7.43 (01/10 0945)  Varicella: Immune  GTT 28 wk: 82     RPR: Non Reactive (01/10 0945)   Rhogam Planned 28 weeks HBsAg: Negative (01/10 0945)   TDaP vaccine  06/18/2018                      HIV: Non Reactive (01/10 0945)   Flu Shot   09/2017                             GBS:   Contraception  Pap: NIL 2020  CBB     CS/VBAC n/a   Baby Food  Breast   Support Person                 Gestational age appropriate obstetric precautions including but not limited to vaginal bleeding, contractions, leaking of fluid and fetal movement were reviewed in detail with the patient.    Advised to start B12 supplementation. She has increased to 1 full iron tablet a day form a 1/2 tablet.   Follow Up Instructions: [place] and ROB in 2 weeks   I discussed the assessment and treatment plan with  the patient. The patient was provided an opportunity to ask questions and all were answered. The patient agreed with the plan and demonstrated an understanding of the instructions.   The patient was advised to call back or seek an in-person evaluation if the symptoms worsen or if the condition fails to improve as anticipated.  I provided 8 minutes of non-face-to-face time during this encounter.  Return in about 2 weeks (around 07/14/2018) for [place] and [place] in person.  Adrian Prows MD [facility], [facility] 06/30/2018 8:22 AM

## 2018-07-14 ENCOUNTER — Ambulatory Visit (INDEPENDENT_AMBULATORY_CARE_PROVIDER_SITE_OTHER): Payer: 59 | Admitting: Obstetrics & Gynecology

## 2018-07-14 ENCOUNTER — Ambulatory Visit (INDEPENDENT_AMBULATORY_CARE_PROVIDER_SITE_OTHER): Payer: 59

## 2018-07-14 ENCOUNTER — Encounter: Payer: Self-pay | Admitting: Obstetrics & Gynecology

## 2018-07-14 ENCOUNTER — Other Ambulatory Visit: Payer: Self-pay

## 2018-07-14 VITALS — BP 120/70 | Wt 162.0 lb

## 2018-07-14 DIAGNOSIS — Z3A34 34 weeks gestation of pregnancy: Secondary | ICD-10-CM

## 2018-07-14 DIAGNOSIS — O26843 Uterine size-date discrepancy, third trimester: Secondary | ICD-10-CM

## 2018-07-14 DIAGNOSIS — Z3403 Encounter for supervision of normal first pregnancy, third trimester: Secondary | ICD-10-CM

## 2018-07-14 LAB — POCT URINALYSIS DIPSTICK OB
Glucose, UA: NEGATIVE
POC,PROTEIN,UA: NEGATIVE

## 2018-07-14 NOTE — Addendum Note (Signed)
Addended by: Quintella Baton D on: 07/14/2018 04:49 PM   Modules accepted: Orders

## 2018-07-14 NOTE — Progress Notes (Signed)
  Subjective  Fetal Movement? yes Contractions? no Leaking Fluid? no Vaginal Bleeding? no  Objective  LMP 11/16/2017 (Exact Date)  General: NAD Pumonary: no increased work of breathing Abdomen: gravid, non-tender Extremities: no edema Psychiatric: mood appropriate, affect full  Assessment  28 y.o. G1P0 at [redacted]w[redacted]d by  08/23/2018, by Last Menstrual Period presenting for routine prenatal visit  Plan   Problem List Items Addressed This Visit      Other   Supervision of normal first pregnancy    Other Visit Diagnoses    [redacted] weeks gestation of pregnancy    -  Primary      Pregnancy #1 Problems (from 11/16/17 to present)    Problem Noted Resolved   Supervision of normal first pregnancy 01/18/2018 by Homero Fellers, MD No   Overview Addendum 07/14/2018  4:30 PM by Gae Dry, MD      [facility]inic [facility] Prenatal Labs  Dating  LMP =8wk [place] Blood type: A/Negative/-- (01/10 0945)   Genetic Screen none Antibody:Negative (01/10 0945)  Anatomic [place] Normal anatomy, anterior placenta Rubella: 7.43 (01/10 0945)  Varicella: Immune  GTT 28 wk: 82     RPR: Non Reactive (01/10 0945)   Rhogam 06/04/18 HBsAg: Negative (01/10 0945)   TDaP vaccine  06/18/2018                      HIV: Non Reactive (01/10 0945)   Flu Shot   09/2017                             GBS:   Contraception  Pap: NIL 2020  CBB  no   CS/VBAC n/a   Baby Food  Breast   Support Person               Review of ULTRASOUND.    I have personally reviewed images and report of recent ultrasound done at [facility].    Plan of management to be discussed with patient.  PNV, [place], PTL precautions CBC nv (too late in day today)  Barnett Applebaum, MD, [facility], [facility] 07/14/2018  4:32 PM

## 2018-07-14 NOTE — Patient Instructions (Signed)
Braxton Hicks Contractions Contractions of the uterus can occur throughout pregnancy, but they are not always a sign that you are in labor. You may have practice contractions called Braxton Hicks contractions. These false labor contractions are sometimes confused with true labor. What are Braxton Hicks contractions? Braxton Hicks contractions are tightening movements that occur in the muscles of the uterus before labor. Unlike true labor contractions, these contractions do not result in opening (dilation) and thinning of the cervix. Toward the end of pregnancy (32-34 weeks), Braxton Hicks contractions can happen more often and may become stronger. These contractions are sometimes difficult to tell apart from true labor because they can be very uncomfortable. You should not feel embarrassed if you go to the hospital with false labor. Sometimes, the only way to tell if you are in true labor is for your health care provider to look for changes in the cervix. The health care provider will do a physical exam and may monitor your contractions. If you are not in true labor, the exam should show that your cervix is not dilating and your water has not broken. If there are no other health problems associated with your pregnancy, it is completely safe for you to be sent home with false labor. You may continue to have Braxton Hicks contractions until you go into true labor. How to tell the difference between true labor and false labor True labor  Contractions last 30-70 seconds.  Contractions become very regular.  Discomfort is usually felt in the top of the uterus, and it spreads to the lower abdomen and low back.  Contractions do not go away with walking.  Contractions usually become more intense and increase in frequency.  The cervix dilates and gets thinner. False labor  Contractions are usually shorter and not as strong as true labor contractions.  Contractions are usually irregular.  Contractions  are often felt in the front of the lower abdomen and in the groin.  Contractions may go away when you walk around or change positions while lying down.  Contractions get weaker and are shorter-lasting as time goes on.  The cervix usually does not dilate or become thin. Follow these instructions at home:   Take over-the-counter and prescription medicines only as told by your health care provider.  Keep up with your usual exercises and follow other instructions from your health care provider.  Eat and drink lightly if you think you are going into labor.  If Braxton Hicks contractions are making you uncomfortable: ? Change your position from lying down or resting to walking, or change from walking to resting. ? Sit and rest in a tub of warm water. ? Drink enough fluid to keep your urine pale yellow. Dehydration may cause these contractions. ? Do slow and deep breathing several times an hour.  Keep all follow-up prenatal visits as told by your health care provider. This is important. Contact a health care provider if:  You have a fever.  You have continuous pain in your abdomen. Get help right away if:  Your contractions become stronger, more regular, and closer together.  You have fluid leaking or gushing from your vagina.  You pass blood-tinged mucus (bloody show).  You have bleeding from your vagina.  You have low back pain that you never had before.  You feel your baby's head pushing down and causing pelvic pressure.  Your baby is not moving inside you as much as it used to. Summary  Contractions that occur before labor are   called Braxton Hicks contractions, false labor, or practice contractions.  Braxton Hicks contractions are usually shorter, weaker, farther apart, and less regular than true labor contractions. True labor contractions usually become progressively stronger and regular, and they become more frequent.  Manage discomfort from [facility] contractions  by changing position, resting in a warm bath, drinking plenty of water, or practicing deep breathing. This information is not intended to replace advice given to you by your health care provider. Make sure you discuss any questions you have with your health care provider. Document Released: 05/08/2016 Document Revised: 12/05/2016 Document Reviewed: 05/08/2016 Elsevier Patient Education  2020 [employer].

## 2018-07-30 ENCOUNTER — Other Ambulatory Visit (HOSPITAL_COMMUNITY)
Admission: RE | Admit: 2018-07-30 | Discharge: 2018-07-30 | Disposition: A | Discharge status: Home / Self Care | Payer: 59 | Source: Ambulatory Visit | Attending: Maternal Newborn | Admitting: Maternal Newborn

## 2018-07-30 ENCOUNTER — Other Ambulatory Visit: Payer: Self-pay

## 2018-07-30 ENCOUNTER — Encounter: Payer: Self-pay | Admitting: Maternal Newborn

## 2018-07-30 ENCOUNTER — Ambulatory Visit (INDEPENDENT_AMBULATORY_CARE_PROVIDER_SITE_OTHER): Payer: 59 | Admitting: Maternal Newborn

## 2018-07-30 VITALS — BP 118/68 | Wt 167.0 lb

## 2018-07-30 DIAGNOSIS — Z3403 Encounter for supervision of normal first pregnancy, third trimester: Secondary | ICD-10-CM | POA: Insufficient documentation

## 2018-07-30 DIAGNOSIS — Z3A36 36 weeks gestation of pregnancy: Secondary | ICD-10-CM

## 2018-07-30 DIAGNOSIS — O99013 Anemia complicating pregnancy, third trimester: Secondary | ICD-10-CM | POA: Diagnosis not present

## 2018-07-30 NOTE — Progress Notes (Signed)
ROB Aptima/GBS C/o braxton hicks, ankle swelling  Denies lof, no vb and Good FM

## 2018-07-30 NOTE — Progress Notes (Signed)
    Routine Prenatal Care Visit  Subjective  Jeanette [name] is a 28 y.o. G1P0 at [redacted]w[redacted]d being seen today for ongoing prenatal care.  She is currently monitored for the following issues for this low-risk pregnancy and has Recurrent UTI and Supervision of normal first pregnancy on their problem list.  ----------------------------------------------------------------------------------- Patient reports some Braxton Hicks contractions and swelling in her ankles.   Contractions: Not present. Vag. Bleeding: None.  Movement: Present. No leaking of fluid.  ----------------------------------------------------------------------------------- The following portions of the patient's history were reviewed and updated as appropriate: allergies, current medications, past family history, past medical history, past social history, past surgical history and problem list. Problem list updated.   Objective  Blood pressure 118/68, weight 167 lb (75.8 kg), last menstrual period 11/16/2017. Pregravid weight 118 lb (53.5 kg) Total Weight Gain 49 lb (22.2 kg)  Fetal Status: Fetal Heart Rate (bpm): 153 Fundal Height: 36 cm Movement: Present     General:  Alert, oriented and cooperative. Patient is in no acute distress.  Skin: Skin is warm and [name]. No rash noted.   Cardiovascular: Normal heart rate noted  Respiratory: Normal respiratory effort, no problems with respiration noted  Abdomen: Soft, gravid, appropriate for gestational age. Pain/Pressure: Absent     Pelvic:  Cervical exam deferred        Extremities: Normal range of motion.  Edema: Trace  Mental Status: Normal mood and affect. Normal behavior. Normal judgment and thought content.     Assessment   28 y.o. G1P0 at [redacted]w[redacted]d, EDD 08/23/2018 by Last Menstrual Period presenting for a routine prenatal visit.  Plan   Pregnancy #1 Problems (from 11/16/17 to present)    Problem Noted Resolved   Supervision of normal first pregnancy 01/18/2018 by Jeanette Fellers, MD No   Overview Addendum 07/14/2018  4:30 PM by Jeanette Dry, MD      Clinic [facility] Prenatal Labs  Dating  LMP =8wk [place] Blood type: A/Negative/-- (01/10 0945)   Genetic Screen none Antibody:Negative (01/10 0945)  Anatomic [place] Normal anatomy, anterior placenta Rubella: 7.43 (01/10 0945)  Varicella: Immune  GTT 28 wk: 82     RPR: Non Reactive (01/10 0945)   Rhogam 06/04/18 HBsAg: Negative (01/10 0945)   TDaP vaccine  06/18/2018                      HIV: Non Reactive (01/10 0945)   Flu Shot   09/2017                             GBS:   Contraception  Pap: NIL 2020  CBB  no   CS/VBAC n/a   Baby Food  Breast   Support Person              Check CBC; GBS, Aptima today.  Preterm labor symptoms and general obstetric precautions including but not limited to vaginal bleeding, contractions, leaking of fluid and fetal movement were reviewed.  Please refer to After Visit Summary for other counseling recommendations.   Return in about 1 week (around 08/06/2018) for ROB.  Jeanette [name], CNM 07/30/2018  1:18 PM

## 2018-07-30 NOTE — Patient Instructions (Signed)
Third Trimester of Pregnancy The third trimester is from week 28 through week 40 (months 7 through 9). The third trimester is a time when the unborn baby (fetus) is growing rapidly. At the end of the ninth month, the fetus is about 20 inches in length and weighs 6-10 pounds. Body changes during your third trimester Your body will continue to go through many changes during pregnancy. The changes vary from woman to woman. During the third trimester:  Your weight will continue to increase. You can expect to gain 25-35 pounds (11-16 kg) by the end of the pregnancy.  You may begin to get stretch marks on your hips, abdomen, and breasts.  You may urinate more often because the fetus is moving lower into your pelvis and pressing on your bladder.  You may develop or continue to have heartburn. This is caused by increased hormones that slow down muscles in the digestive tract.  You may develop or continue to have constipation because increased hormones slow digestion and cause the muscles that push waste through your intestines to relax.  You may develop hemorrhoids. These are swollen veins (varicose veins) in the rectum that can itch or be painful.  You may develop swollen, bulging veins (varicose veins) in your legs.  You may have increased body aches in the pelvis, back, or thighs. This is due to weight gain and increased hormones that are relaxing your joints.  You may have changes in your hair. These can include thickening of your hair, rapid growth, and changes in texture. Some women also have hair loss during or after pregnancy, or hair that feels dry or thin. Your hair will most likely return to normal after your baby is born.  Your breasts will continue to grow and they will continue to become tender. A yellow fluid (colostrum) may leak from your breasts. This is the first milk you are producing for your baby.  Your belly button may stick out.  You may notice more swelling in your hands,  face, or ankles.  You may have increased tingling or numbness in your hands, arms, and legs. The skin on your belly may also feel numb.  You may feel short of breath because of your expanding uterus.  You may have more problems sleeping. This can be caused by the size of your belly, increased need to urinate, and an increase in your body's metabolism.  You may notice the fetus "dropping," or moving lower in your abdomen (lightening).  You may have increased vaginal discharge.  You may notice your joints feel loose and you may have pain around your pelvic bone. What to expect at prenatal visits You will have prenatal exams every 2 weeks until week 36. Then you will have weekly prenatal exams. During a routine prenatal visit:  You will be weighed to make sure you and the baby are growing normally.  Your blood pressure will be taken.  Your abdomen will be measured to track your baby's growth.  The fetal heartbeat will be listened to.  Any test results from the previous visit will be discussed.  You may have a cervical check near your due date to see if your cervix has softened or thinned (effaced).  You will be tested for Group B streptococcus. This happens between 35 and 37 weeks. Your health care provider may ask you:  What your birth plan is.  How you are feeling.  If you are feeling the baby move.  If you have had any abnormal   symptoms, such as leaking fluid, bleeding, severe headaches, or abdominal cramping.  If you are using any tobacco products, including cigarettes, chewing tobacco, and electronic cigarettes.  If you have any questions. Other tests or screenings that may be performed during your third trimester include:  Blood tests that check for low iron levels (anemia).  Fetal testing to check the health, activity level, and growth of the fetus. Testing is done if you have certain medical conditions or if there are problems during the pregnancy.  Nonstress test  (NST). This test checks the health of your baby to make sure there are no signs of problems, such as the baby not getting enough oxygen. During this test, a belt is placed around your belly. The baby is made to move, and its heart rate is monitored during movement. What is false labor? False labor is a condition in which you feel small, irregular tightenings of the muscles in the womb (contractions) that usually go away with rest, changing position, or drinking water. These are called Braxton Hicks contractions. Contractions may last for hours, days, or even weeks before true labor sets in. If contractions come at regular intervals, become more frequent, increase in intensity, or become painful, you should see your health care provider. What are the signs of labor?  Abdominal cramps.  Regular contractions that start at 10 minutes apart and become stronger and more frequent with time.  Contractions that start on the top of the uterus and spread down to the lower abdomen and back.  Increased pelvic pressure and dull back pain.  A watery or bloody mucus discharge that comes from the vagina.  Leaking of amniotic fluid. This is also known as your "water breaking." It could be a slow trickle or a gush. Let your health care provider know if it has a color or strange odor. If you have any of these signs, call your health care provider right away, even if it is before your due date. Follow these instructions at home: Medicines  Follow your health care provider's instructions regarding medicine use. Specific medicines may be either safe or unsafe to take during pregnancy.  Take a prenatal vitamin that contains at least 600 micrograms (mcg) of folic acid.  If you develop constipation, try taking a stool softener if your health care provider approves. Eating and drinking   Eat a balanced diet that includes fresh fruits and vegetables, whole grains, good sources of protein such as meat, eggs, or tofu,  and low-fat dairy. Your health care provider will help you determine the amount of weight gain that is right for you.  Avoid raw meat and uncooked cheese. These carry germs that can cause birth defects in the baby.  If you have low calcium intake from food, talk to your health care provider about whether you should take a daily calcium supplement.  Eat four or five small meals rather than three large meals a day.  Limit foods that are high in fat and processed sugars, such as fried and sweet foods.  To prevent constipation: ? Drink enough fluid to keep your urine clear or pale yellow. ? Eat foods that are high in fiber, such as fresh fruits and vegetables, whole grains, and beans. Activity  Exercise only as directed by your health care provider. Most women can continue their usual exercise routine during pregnancy. Try to exercise for 30 minutes at least 5 days a week. Stop exercising if you experience uterine contractions.  Avoid heavy lifting.  Do   not exercise in extreme heat or humidity, or at high altitudes.  Wear low-heel, comfortable shoes.  Practice good posture.  You may continue to have sex unless your health care provider tells you otherwise. Relieving pain and discomfort  Take frequent breaks and rest with your legs elevated if you have leg cramps or low back pain.  Take warm sitz baths to soothe any pain or discomfort caused by hemorrhoids. Use hemorrhoid cream if your health care provider approves.  Wear a good support bra to prevent discomfort from breast tenderness.  If you develop varicose veins: ? Wear support pantyhose or compression stockings as told by your healthcare provider. ? Elevate your feet for 15 minutes, 3-4 times a day. Prenatal care  Write down your questions. Take them to your prenatal visits.  Keep all your prenatal visits as told by your health care provider. This is important. Safety  Wear your seat belt at all times when driving.  Make  a list of emergency phone numbers, including numbers for family, friends, the hospital, and police and fire departments. General instructions  Avoid cat litter boxes and soil used by cats. These carry germs that can cause birth defects in the baby. If you have a cat, ask someone to clean the litter box for you.  Do not travel far distances unless it is absolutely necessary and only with the approval of your health care provider.  Do not use hot tubs, steam rooms, or saunas.  Do not drink alcohol.  Do not use any products that contain nicotine or tobacco, such as cigarettes and e-cigarettes. If you need help quitting, ask your health care provider.  Do not use any medicinal herbs or unprescribed drugs. These chemicals affect the formation and growth of the baby.  Do not douche or use tampons or scented sanitary pads.  Do not cross your legs for long periods of time.  To prepare for the arrival of your baby: ? Take prenatal classes to understand, practice, and ask questions about labor and delivery. ? Make a trial run to the hospital. ? Visit the hospital and tour the maternity area. ? Arrange for maternity or paternity leave through employers. ? Arrange for family and friends to take care of pets while you are in the hospital. ? Purchase a rear-facing car seat and make sure you know how to install it in your car. ? Pack your hospital bag. ? Prepare the baby's nursery. Make sure to remove all pillows and stuffed animals from the baby's crib to prevent suffocation.  Visit your dentist if you have not gone during your pregnancy. Use a soft toothbrush to brush your teeth and be gentle when you floss. Contact a health care provider if:  You are unsure if you are in labor or if your water has broken.  You become dizzy.  You have mild pelvic cramps, pelvic pressure, or nagging pain in your abdominal area.  You have lower back pain.  You have persistent nausea, vomiting, or diarrhea.   You have an unusual or bad smelling vaginal discharge.  You have pain when you urinate. Get help right away if:  Your water breaks before 37 weeks.  You have regular contractions less than 5 minutes apart before 37 weeks.  You have a fever.  You are leaking fluid from your vagina.  You have spotting or bleeding from your vagina.  You have severe abdominal pain or cramping.  You have rapid weight loss or weight gain.  You have  shortness of breath with chest pain.  You notice sudden or extreme swelling of your face, hands, ankles, feet, or legs.  Your baby makes fewer than 10 movements in 2 hours.  You have severe headaches that do not go away when you take medicine.  You have vision changes. Summary  The third trimester is from week 28 through week 40, months 7 through 9. The third trimester is a time when the unborn baby (fetus) is growing rapidly.  During the third trimester, your discomfort may increase as you and your baby continue to gain weight. You may have abdominal, leg, and back pain, sleeping problems, and an increased need to urinate.  During the third trimester your breasts will keep growing and they will continue to become tender. A yellow fluid (colostrum) may leak from your breasts. This is the first milk you are producing for your baby.  False labor is a condition in which you feel small, irregular tightenings of the muscles in the womb (contractions) that eventually go away. These are called Braxton Hicks contractions. Contractions may last for hours, days, or even weeks before true labor sets in.  Signs of labor can include: abdominal cramps; regular contractions that start at 10 minutes apart and become stronger and more frequent with time; watery or bloody mucus discharge that comes from the vagina; increased pelvic pressure and dull back pain; and leaking of amniotic fluid. This information is not intended to replace advice given to you by your health  care provider. Make sure you discuss any questions you have with your health care provider. Document Released: 12/17/2000 Document Revised: 04/15/2018 Document Reviewed: 01/29/2016 Elsevier Patient Education  2020 [employer].

## 2018-07-31 LAB — CBC
Hematocrit: 34.7 % (ref 34.0–46.6)
Hemoglobin: 11.6 g/dL (ref 11.1–15.9)
MCH: 30.3 pg (ref 26.6–33.0)
MCHC: 33.4 g/dL (ref 31.5–35.7)
MCV: 91 fL (ref 79–97)
Platelets: 208 10*3/uL (ref 150–450)
RBC: 3.83 x10E6/uL (ref 3.77–5.28)
RDW: 12.3 % (ref 11.7–15.4)
WBC: 9.9 10*3/uL (ref 3.4–10.8)

## 2018-07-31 LAB — CERVICOVAGINAL ANCILLARY ONLY
Chlamydia: NEGATIVE
Neisseria Gonorrhea: NEGATIVE

## 2018-08-01 LAB — STREP GP B NAA: Strep Gp B NAA: NEGATIVE

## 2018-08-06 ENCOUNTER — Encounter: Payer: Self-pay | Admitting: Obstetrics & Gynecology

## 2018-08-06 ENCOUNTER — Ambulatory Visit (INDEPENDENT_AMBULATORY_CARE_PROVIDER_SITE_OTHER): Payer: 59 | Admitting: Obstetrics & Gynecology

## 2018-08-06 ENCOUNTER — Other Ambulatory Visit: Payer: Self-pay

## 2018-08-06 VITALS — BP 112/54 | Wt 170.0 lb

## 2018-08-06 DIAGNOSIS — Z3403 Encounter for supervision of normal first pregnancy, third trimester: Secondary | ICD-10-CM

## 2018-08-06 DIAGNOSIS — Z3A37 37 weeks gestation of pregnancy: Secondary | ICD-10-CM

## 2018-08-06 LAB — POCT URINALYSIS DIPSTICK OB
Glucose, UA: NEGATIVE
POC,PROTEIN,UA: NEGATIVE

## 2018-08-06 NOTE — Progress Notes (Signed)
  Subjective  Fetal Movement? yes Contractions? no Leaking Fluid? no Vaginal Bleeding? no  Objective  BP (!) 112/54   Wt 170 lb (77.1 kg)   LMP 11/16/2017 (Exact Date)   BMI 28.29 kg/m  General: NAD Pumonary: no increased work of breathing Abdomen: gravid, non-tender Extremities: no edema Psychiatric: mood appropriate, affect full  Assessment  28 y.o. G1P0 at [redacted]w[redacted]d by  08/23/2018, by Last Menstrual Period presenting for routine prenatal visit  Plan   Problem List Items Addressed This Visit      Other   Supervision of normal first pregnancy    Other Visit Diagnoses    [redacted] weeks gestation of pregnancy    -  Primary   Relevant Orders   POC Urinalysis Dipstick OB (Completed)    PNV, Ullin, Labor precautions IOL 41 weeks or as indicated  Pregnancy #1 Problems (from 11/16/17 to present)    Problem Noted Resolved   Supervision of normal first pregnancy 01/18/2018 by Homero Fellers, MD No   Overview Addendum 07/14/2018  4:30 PM by Gae Dry, MD      [facility] Prenatal Labs  Dating  LMP =8wk [place] Blood type: A/Negative/-- (01/10 0945)   Genetic Screen none Antibody:Negative (01/10 0945)  Anatomic [place] Normal anatomy, anterior placenta Rubella: 7.43 (01/10 0945)  Varicella: Immune  GTT 28 wk: 82     RPR: Non Reactive (01/10 0945)   Rhogam 06/04/18 HBsAg: Negative (01/10 0945)   TDaP vaccine  06/18/2018                      HIV: Non Reactive (01/10 0945)   Flu Shot   09/2017                             GBS: NEG  Contraception   Unsure, no IUD or Nexplanon Pap: NIL 2020  CBB  no   CS/VBAC n/a   Baby Food  Breast   Support Person  Husband               Barnett Applebaum, MD, [facility], [facility] 08/06/2018  8:26 AM

## 2018-08-13 ENCOUNTER — Ambulatory Visit (INDEPENDENT_AMBULATORY_CARE_PROVIDER_SITE_OTHER): Payer: 59 | Admitting: Advanced Practice Midwife

## 2018-08-13 ENCOUNTER — Other Ambulatory Visit: Payer: Self-pay

## 2018-08-13 ENCOUNTER — Encounter: Payer: Self-pay | Admitting: Advanced Practice Midwife

## 2018-08-13 DIAGNOSIS — Z3403 Encounter for supervision of normal first pregnancy, third trimester: Secondary | ICD-10-CM

## 2018-08-13 DIAGNOSIS — Z3A38 38 weeks gestation of pregnancy: Secondary | ICD-10-CM

## 2018-08-13 NOTE — Progress Notes (Signed)
Routine Prenatal Care Visit- Virtual Visit  Subjective   Virtual Visit via Telephone Note  I connected with Jeanette [name] on 08/13/18 at  9:30 AM EDT by telephone and verified that I am speaking with the correct person using two identifiers.   I discussed the limitations, risks, security and privacy concerns of performing an evaluation and management service by telephone and the availability of in person appointments. I also discussed with the patient that there may be a patient responsible charge related to this service. The patient expressed understanding and agreed to proceed.  The patient was at home I spoke with the patient from my  Office phone The names of people involved in this encounter were: the patient Jeanette [name] and myself Rod Can, CNM   Jeanette [name] is a 28 y.o. G1P0 at 100w4d being seen today for ongoing prenatal care.  She is currently monitored for the following issues for this low-risk pregnancy and has Recurrent UTI and Supervision of normal first pregnancy on their problem list.  ----------------------------------------------------------------------------------- Patient reports no complaints.   Contractions: Not present. Vag. Bleeding: None.  Movement: Present. Denies leaking of fluid.  ----------------------------------------------------------------------------------- The following portions of the patient's history were reviewed and updated as appropriate: allergies, current medications, past family history, past medical history, past social history, past surgical history and problem list. Problem list updated.   Objective  Last menstrual period 11/16/2017. Pregravid weight 118 lb (53.5 kg) Total Weight Gain 52 lb (23.6 kg) Urinalysis:      Fetal Status:     Movement: Present     Physical Exam could not be performed. Because of the COVID-19 outbreak this visit was performed over the phone and not in person.   Assessment   28 y.o. G1P0 at [redacted]w[redacted]d by   08/23/2018, by Last Menstrual Period presenting for routine prenatal visit  Plan   Pregnancy #1 Problems (from 11/16/17 to present)    Problem Noted Resolved   Supervision of normal first pregnancy 01/18/2018 by Homero Fellers, MD No   Overview Addendum 08/06/2018  8:28 AM by Gae Dry, MD      [facility]inic [facility] Prenatal Labs  Dating  LMP =8wk [place] Blood type: A/Negative/-- (01/10 0945)   Genetic Screen none Antibody:Negative (05/29 0930)  Anatomic [place] Normal anatomy, anterior placenta Rubella: 7.43 (01/10 0945)  Varicella: Immune  GTT 28 wk: 82     RPR: Non Reactive (05/29 0930)   Rhogam 06/04/18 HBsAg: Negative (01/10 0945)   TDaP vaccine  06/18/2018                      HIV: Non Reactive (05/29 0930)   Flu Shot   09/2017                             [id number] (07/24 1515)  Contraception   Unsure; no IUD or Nexplanon Pap: NIL 2020  CBB  no   CS/VBAC n/a   Baby Food  Breast   Support Person    Husband               Gestational age appropriate obstetric precautions including but not limited to vaginal bleeding, contractions, leaking of fluid and fetal movement were reviewed in detail with the patient.     Follow Up Instructions: Birth control options discussed. Patient is undecided but does not want IUD or Nexplanon.   I discussed the assessment and treatment plan  with the patient. The patient was provided an opportunity to ask questions and all were answered. The patient agreed with the plan and demonstrated an understanding of the instructions.   The patient was advised to call back or seek an in-person evaluation if the symptoms worsen or if the condition fails to improve as anticipated.  I provided 10 minutes of non-face-to-face time during this encounter.  Return in about 1 week (around 08/20/2018) for rob.   Rod Can, [facility] 08/13/2018, 9:40 AM

## 2018-08-13 NOTE — Progress Notes (Signed)
ROB- no concerns 

## 2018-08-23 ENCOUNTER — Encounter: Payer: Self-pay | Admitting: Maternal Newborn

## 2018-08-23 ENCOUNTER — Other Ambulatory Visit: Payer: Self-pay

## 2018-08-23 ENCOUNTER — Ambulatory Visit (INDEPENDENT_AMBULATORY_CARE_PROVIDER_SITE_OTHER): Payer: 59 | Admitting: Maternal Newborn

## 2018-08-23 VITALS — BP 120/80 | Wt 168.0 lb

## 2018-08-23 DIAGNOSIS — O480 Post-term pregnancy: Secondary | ICD-10-CM

## 2018-08-23 DIAGNOSIS — Z3400 Encounter for supervision of normal first pregnancy, unspecified trimester: Secondary | ICD-10-CM

## 2018-08-23 DIAGNOSIS — Z3A40 40 weeks gestation of pregnancy: Secondary | ICD-10-CM

## 2018-08-23 LAB — POCT URINALYSIS DIPSTICK OB
Glucose, UA: NEGATIVE
POC,PROTEIN,UA: NEGATIVE

## 2018-08-23 NOTE — Progress Notes (Signed)
    Routine Prenatal Care Visit  Subjective  Jeanette [name] is a 28 y.o. G1P0 at [redacted]w[redacted]d being seen today for ongoing prenatal care.  She is currently monitored for the following issues for this low-risk pregnancy and has Recurrent UTI and Supervision of normal first pregnancy on their problem list.  ----------------------------------------------------------------------------------- Patient reports occasional contractions.   Contractions: Not present. Vag. Bleeding: None.  Movement: Present. No leaking of fluid.  ----------------------------------------------------------------------------------- The following portions of the patient's history were reviewed and updated as appropriate: allergies, current medications, past family history, past medical history, past social history, past surgical history and problem list. Problem list updated.   Objective  Blood pressure 120/80, weight 168 lb (76.2 kg), last menstrual period 11/16/2017. Pregravid weight 118 lb (53.5 kg) Total Weight Gain 50 lb (22.7 kg)   Fetal Status: Fetal Heart Rate (bpm): 147 Fundal Height: 39 cm Movement: Present  Presentation: Vertex  General:  Alert, oriented and cooperative. Patient is in no acute distress.  Skin: Skin is warm and dry. No rash noted.   Cardiovascular: Normal heart rate noted  Respiratory: Normal respiratory effort, no problems with respiration noted  Abdomen: Soft, gravid, appropriate for gestational age. Pain/Pressure: Present     Pelvic:  Cervical exam performed        Extremities: Normal range of motion.  Edema: None  Mental Status: Normal mood and affect. Normal behavior. Normal judgment and thought content.     Assessment   28 y.o. G1P0 at [redacted]w[redacted]d, EDD 08/23/2018 by Last Menstrual Period presenting for a routine prenatal visit.  Plan   Pregnancy #1 Problems (from 11/16/17 to present)    Problem Noted Resolved   Supervision of normal first pregnancy 01/18/2018 by Homero Fellers, MD No   Overview Addendum 08/06/2018  8:28 AM by Gae Dry, MD      [facility]inic [facility] Prenatal Labs  Dating  LMP =8wk [place] Blood type: A/Negative/-- (01/10 0945)   Genetic Screen none Antibody:Negative (05/29 0930)  Anatomic [place] Normal anatomy, anterior placenta Rubella: 7.43 (01/10 0945)  Varicella: Immune  GTT 28 wk: 82     RPR: Non Reactive (05/29 0930)   Rhogam 06/04/18 HBsAg: Negative (01/10 0945)   TDaP vaccine  06/18/2018                      HIV: Non Reactive (05/29 0930)   Flu Shot   09/2017                             [id number] (07/24 1515)  Contraception   Unsure; no IUD or Nexplanon Pap: NIL 2020  CBB  no   CS/VBAC n/a   Baby Food  Breast   Support Person    HUsband               Term labor symptoms and general obstetric precautions including but not limited to vaginal bleeding, contractions, leaking of fluid and fetal movement were reviewed.  Please refer to After Visit Summary for other counseling recommendations.   Labor induction scheduled for 08/30/2018 at 0800.   Avel Sensor, CNM 08/23/2018  1:17 PM

## 2018-08-23 NOTE — Patient Instructions (Signed)
Pre-admit COVID testing: Please go to [facility] on Friday (08/27/2018) for drive up testing between 9:00 and 10:00.  Your induction is scheduled on 08/30/2018 at 0800 on Labor and Delivery.   Labor Induction  Labor induction is when steps are taken to cause a pregnant woman to begin the labor process. Most women go into labor on their own between 37 weeks and 42 weeks of pregnancy. When this does not happen or when there is a medical need for labor to begin, steps may be taken to induce labor. Labor induction causes a pregnant woman's uterus to contract. It also causes the cervix to soften (ripen), open (dilate), and thin out (efface). Usually, labor is not induced before 39 weeks of pregnancy unless there is a medical reason to do so. Your health care provider will determine if labor induction is needed. Before inducing labor, your health care provider will consider a number of factors, including:  Your medical condition and your baby's.  How many weeks along you are in your pregnancy.  How mature your baby's lungs are.  The condition of your cervix.  The position of your baby.  The size of your birth canal. What are some reasons for labor induction? Labor may be induced if:  Your health or your baby's health is at risk.  Your pregnancy is overdue by 1 week or more.  Your water breaks but labor does not start on its own.  There is a low amount of amniotic fluid around your baby. You may also choose (elect) to have labor induced at a certain time. Generally, elective labor induction is done no earlier than 39 weeks of pregnancy. What methods are used for labor induction? Methods used for labor induction include:  Prostaglandin medicine. This medicine starts contractions and causes the cervix to dilate and ripen. It can be taken by mouth (orally) or by being inserted into the vagina (suppository).  Inserting a small, thin tube (catheter) with a balloon into the vagina  and then expanding the balloon with water to dilate the cervix.  Stripping the membranes. In this method, your health care provider gently separates amniotic sac tissue from the cervix. This causes the cervix to stretch, which in turn causes the release of a hormone called progesterone. The hormone causes the uterus to contract. This procedure is often done during an office visit, after which you will be sent home to wait for contractions to begin.  Breaking the water. In this method, your health care provider uses a small instrument to make a small hole in the amniotic sac. This eventually causes the amniotic sac to break. Contractions should begin after a few hours.  Medicine to trigger or strengthen contractions. This medicine is given through an IV that is inserted into a vein in your arm. Except for membrane stripping, which can be done in a clinic, labor induction is done in the hospital so that you and your baby can be carefully monitored. How long does it take for labor to be induced? The length of time it takes to induce labor depends on how ready your body is for labor. Some inductions can take up to 2-3 days, while others may take less than a day. Induction may take longer if:  You are induced early in your pregnancy.  It is your first pregnancy.  Your cervix is not ready. What are some risks associated with labor induction? Some risks associated with labor induction include:  Changes in fetal heart rate,  such as being too high, too low, or irregular (erratic).  Failed induction.  Infection in the mother or the baby.  Increased risk of having a cesarean delivery.  Fetal death.  Breaking off (abruption) of the placenta from the uterus (rare).  Rupture of the uterus (very rare). When induction is needed for medical reasons, the benefits of induction generally outweigh the risks. What are some reasons for not inducing labor? Labor induction should not be done if:  Your  baby does not tolerate contractions.  You have had previous surgeries on your uterus, such as a myomectomy, removal of fibroids, or a vertical scar from a previous cesarean delivery.  Your placenta lies very low in your uterus and blocks the opening of the cervix (placenta previa).  Your baby is not in a head-down position.  The umbilical cord drops down into the birth canal in front of the baby.  There are unusual circumstances, such as the baby being very early (premature).  You have had more than 2 previous cesarean deliveries. Summary  Labor induction is when steps are taken to cause a pregnant woman to begin the labor process.  Labor induction causes a pregnant woman's uterus to contract. It also causes the cervix to ripen, dilate, and efface.  Labor is not induced before 39 weeks of pregnancy unless there is a medical reason to do so.  When induction is needed for medical reasons, the benefits of induction generally outweigh the risks. This information is not intended to replace advice given to you by your health care provider. Make sure you discuss any questions you have with your health care provider. Document Released: 05/14/2006 Document Revised: 12/26/2016 Document Reviewed: 02/06/2016 Elsevier Patient Education  2020 [employer].

## 2018-08-23 NOTE — Addendum Note (Signed)
Addended by: Quintella Baton D on: 08/23/2018 02:01 PM   Modules accepted: Orders

## 2018-08-23 NOTE — Progress Notes (Signed)
Obstetrics Admission History & Physical   Postdates Induction of Labor  HPI:  28 y.o. G1P0 @ [redacted]w[redacted]d (08/23/2018, by Last Menstrual Period). Admitted on (Not on file):   Patient Active Problem List   Diagnosis Date Noted  . Supervision of normal first pregnancy 01/18/2018  . Recurrent UTI 04/01/2017     Presents for H&P for postdates induction of labor scheduled for 08/30/2018.  She is having some Braxton-Hicks contractions and pelvic pressure. Her baby is moving well. She is not having any loss of fluid or vaginal bleeding. She has no other symptoms today.  Prenatal care at: at [facility]. Pregnancy complicated by none.  ROS: A review of systems was performed and negative, except as stated in the above HPI.  PMHx:  Past Medical History:  Diagnosis Date  . No known health problems   . UTI (urinary tract infection)    PSHx:  Past Surgical History:  Procedure Laterality Date  . NO PAST SURGERIES     Medications:  Outpatient Encounter Medications as of 08/23/2018  Medication Sig  . ferrous sulfate 325 (65 FE) MG tablet Take 325 mg by mouth daily with breakfast.  . Prenatal MV-Min-FA-Omega-3 (PRENATAL GUMMIES/DHA & FA) 0.4-32.5 MG CHEW Chew by mouth.  . vitamin B-12 (CYANOCOBALAMIN) 100 MCG tablet Take 100 mcg by mouth daily.   No facility-administered encounter medications on file as of 08/23/2018.    Allergies: has No Known Allergies.   OBHx:  OB History  Gravida Para Term Preterm AB Living  1            SAB TAB Ectopic Multiple Live Births               # Outcome Date GA Lbr Len/2nd Weight Sex Delivery Anes PTL Lv  1 Current            FHx:  Family History  Problem Relation Age of Onset  . Hypertension Father   . Hypertension Maternal Grandfather   . Thyroid disease Maternal Grandfather   . Diabetes Paternal Grandfather   . Colon cancer Other 21   Soc Hx: Never smoker, Alcohol: none and Recreational drug use: none  Objective:   Vitals:   08/23/18 1124  BP:  120/80   Constitutional: Well nourished, well developed female in no acute distress.  HEENT: normal Skin: Warm and dry.  Cardiovascular: Regular rate and rhythm.   Extremity: trace to 1+ bilateral pedal edema Respiratory: Clear to auscultation bilaterally. Normal respiratory effort Abdomen: gravid, non-tender Neuro: Cranial nerves grossly intact Psych: Alert and Oriented x3. No memory deficits. Normal mood and affect.  MS: normal gait, normal bilateral lower extremity ROM/strength/stability.  Pelvic exam: is not limited by body habitus External Genitalia, Bartholin's glands, Urethra, Skene's glands: within normal limits Vagina: within normal limits and with no blood in the vault Cervix: 0/thick/-3  FHT 147 bpm via Doppler.   Perinatal info:  Blood type: A negative Rubella- Immune Varicella -Immune TDaP Given during third trimester of this pregnancy on 06/18/2018 RPR NR / HIV Neg/ HBsAg Neg   Assessment & Plan:   28 y.o. G1P0 @ [redacted]w[redacted]d, Admitted for a postdates induction of labor   Begin induction with Cytotec.   Observe for cervical change, Epidural when ready, AROM when Appropriate and GBS status negative, treat as needed.  Avel Sensor, CNM [facility] 08/23/2018  1:19 PM

## 2018-08-27 ENCOUNTER — Other Ambulatory Visit
Admission: RE | Admit: 2018-08-27 | Discharge: 2018-08-27 | Disposition: A | Discharge status: Home / Self Care | Payer: 59 | Source: Ambulatory Visit | Attending: Maternal Newborn | Admitting: Maternal Newborn

## 2018-08-27 ENCOUNTER — Inpatient Hospital Stay
Admission: EM | Admit: 2018-08-27 | Discharge: 2018-08-29 | DRG: 806 | Disposition: A | Discharge status: Home / Self Care | Payer: 59 | Attending: Certified Nurse Midwife | Admitting: Certified Nurse Midwife

## 2018-08-27 ENCOUNTER — Other Ambulatory Visit: Payer: Self-pay

## 2018-08-27 ENCOUNTER — Encounter: Payer: Self-pay | Admitting: *Deleted

## 2018-08-27 DIAGNOSIS — D62 Acute posthemorrhagic anemia: Secondary | ICD-10-CM | POA: Diagnosis not present

## 2018-08-27 DIAGNOSIS — O26893 Other specified pregnancy related conditions, third trimester: Secondary | ICD-10-CM | POA: Diagnosis present

## 2018-08-27 DIAGNOSIS — O480 Post-term pregnancy: Secondary | ICD-10-CM | POA: Diagnosis not present

## 2018-08-27 DIAGNOSIS — Z20828 Contact with and (suspected) exposure to other viral communicable diseases: Secondary | ICD-10-CM | POA: Diagnosis present

## 2018-08-27 DIAGNOSIS — Z3400 Encounter for supervision of normal first pregnancy, unspecified trimester: Secondary | ICD-10-CM

## 2018-08-27 DIAGNOSIS — O9081 Anemia of the puerperium: Secondary | ICD-10-CM | POA: Diagnosis not present

## 2018-08-27 DIAGNOSIS — Z01812 Encounter for preprocedural laboratory examination: Secondary | ICD-10-CM | POA: Insufficient documentation

## 2018-08-27 DIAGNOSIS — Z3A40 40 weeks gestation of pregnancy: Secondary | ICD-10-CM

## 2018-08-27 DIAGNOSIS — Z6791 Unspecified blood type, Rh negative: Secondary | ICD-10-CM

## 2018-08-27 LAB — CBC
HCT: 35.2 % — ABNORMAL LOW (ref 36.0–46.0)
Hemoglobin: 12.1 g/dL (ref 12.0–15.0)
MCH: 30 pg (ref 26.0–34.0)
MCHC: 34.4 g/dL (ref 30.0–36.0)
MCV: 87.3 fL (ref 80.0–100.0)
Platelets: 200 10*3/uL (ref 150–400)
RBC: 4.03 MIL/uL (ref 3.87–5.11)
RDW: 12.5 % (ref 11.5–15.5)
WBC: 14.5 10*3/uL — ABNORMAL HIGH (ref 4.0–10.5)
nRBC: 0 % (ref 0.0–0.2)

## 2018-08-27 LAB — ABO/RH: ABO/RH(D): A NEG

## 2018-08-27 LAB — SARS CORONAVIRUS 2 (TAT 6-24 HRS): SARS Coronavirus 2: NEGATIVE

## 2018-08-27 LAB — SARS CORONAVIRUS 2 BY RT PCR (HOSPITAL ORDER, PERFORMED IN ~~LOC~~ HOSPITAL LAB): SARS Coronavirus 2: NEGATIVE

## 2018-08-27 MED ORDER — LIDOCAINE HCL (PF) 1 % IJ SOLN
INTRAMUSCULAR | Status: AC
  Administered 2018-08-27: 30 mL via SUBCUTANEOUS
  Filled 2018-08-27: qty 30

## 2018-08-27 MED ORDER — DIBUCAINE (PERIANAL) 1 % EX OINT: 1.0000 "application " | TOPICAL_OINTMENT | CUTANEOUS | Status: DC | PRN

## 2018-08-27 MED ORDER — TERBUTALINE SULFATE 1 MG/ML IJ SOLN: 0.2500 mg | Freq: Once | INTRAMUSCULAR | Status: DC | PRN

## 2018-08-27 MED ORDER — OXYTOCIN 40 UNITS IN NORMAL SALINE INFUSION - SIMPLE MED
INTRAVENOUS | Status: AC
  Administered 2018-08-27: 500 mL via INTRAVENOUS
  Filled 2018-08-27: qty 1000

## 2018-08-27 MED ORDER — FENTANYL CITRATE (PF) 100 MCG/2ML IJ SOLN: 50.0000 ug | INTRAMUSCULAR | Status: DC | PRN

## 2018-08-27 MED ORDER — ACETAMINOPHEN 325 MG PO TABS: 650.0000 mg | ORAL_TABLET | ORAL | Status: DC | PRN

## 2018-08-27 MED ORDER — BENZOCAINE-MENTHOL 20-0.5 % EX AERO
INHALATION_SPRAY | CUTANEOUS | Status: AC
  Filled 2018-08-27: qty 56

## 2018-08-27 MED ORDER — PRENATAL MULTIVITAMIN CH
1.0000 | ORAL_TABLET | Freq: Every day | ORAL | Status: DC
  Filled 2018-08-27: qty 1

## 2018-08-27 MED ORDER — MISOPROSTOL 25 MCG QUARTER TABLET: 25.0000 ug | ORAL_TABLET | ORAL | Status: DC | PRN

## 2018-08-27 MED ORDER — IBUPROFEN 600 MG PO TABS
600.0000 mg | ORAL_TABLET | Freq: Four times a day (QID) | ORAL | Status: DC
  Administered 2018-08-28: 600 mg via ORAL
  Filled 2018-08-27 (×3): qty 1

## 2018-08-27 MED ORDER — COCONUT OIL OIL
1.0000 "application " | TOPICAL_OIL | Status: DC | PRN
  Administered 2018-08-28: 1 via TOPICAL
  Filled 2018-08-27: qty 120

## 2018-08-27 MED ORDER — ONDANSETRON HCL 4 MG/2ML IJ SOLN: 4.0000 mg | INTRAMUSCULAR | Status: DC | PRN

## 2018-08-27 MED ORDER — AMMONIA AROMATIC IN INHA
RESPIRATORY_TRACT | Status: AC
  Filled 2018-08-27: qty 10

## 2018-08-27 MED ORDER — METHYLERGONOVINE MALEATE 0.2 MG PO TABS
0.2000 mg | ORAL_TABLET | Freq: Four times a day (QID) | ORAL | Status: AC
  Administered 2018-08-27 – 2018-08-28 (×4): 0.2 mg via ORAL
  Filled 2018-08-27 (×4): qty 1

## 2018-08-27 MED ORDER — ACETAMINOPHEN 325 MG PO TABS
650.0000 mg | ORAL_TABLET | ORAL | Status: DC | PRN
  Administered 2018-08-28 (×2): 650 mg via ORAL
  Filled 2018-08-27 (×2): qty 2

## 2018-08-27 MED ORDER — MISOPROSTOL 200 MCG PO TABS
ORAL_TABLET | ORAL | Status: AC
  Filled 2018-08-27: qty 4

## 2018-08-27 MED ORDER — LACTATED RINGERS IV SOLN
INTRAVENOUS | Status: DC
  Administered 2018-08-27: 13:00:00 via INTRAVENOUS

## 2018-08-27 MED ORDER — OXYTOCIN 10 UNIT/ML IJ SOLN
INTRAMUSCULAR | Status: AC
  Filled 2018-08-27: qty 2

## 2018-08-27 MED ORDER — ONDANSETRON HCL 4 MG/2ML IJ SOLN: 4.0000 mg | Freq: Four times a day (QID) | INTRAMUSCULAR | Status: DC | PRN

## 2018-08-27 MED ORDER — ONDANSETRON HCL 4 MG PO TABS: 4.0000 mg | ORAL_TABLET | ORAL | Status: DC | PRN

## 2018-08-27 MED ORDER — WITCH HAZEL-GLYCERIN EX PADS: 1.0000 "application " | MEDICATED_PAD | CUTANEOUS | Status: DC | PRN

## 2018-08-27 MED ORDER — BENZOCAINE-MENTHOL 20-0.5 % EX AERO: 1.0000 "application " | INHALATION_SPRAY | CUTANEOUS | Status: DC | PRN

## 2018-08-27 MED ORDER — LACTATED RINGERS IV SOLN: 500.0000 mL | INTRAVENOUS | Status: DC | PRN

## 2018-08-27 MED ORDER — OXYTOCIN BOLUS FROM INFUSION
500.0000 mL | Freq: Once | INTRAVENOUS | Status: AC
  Administered 2018-08-27: 17:00:00 500 mL via INTRAVENOUS

## 2018-08-27 MED ORDER — SENNOSIDES-DOCUSATE SODIUM 8.6-50 MG PO TABS
2.0000 | ORAL_TABLET | ORAL | Status: DC
  Administered 2018-08-28: 2 via ORAL
  Filled 2018-08-27: qty 2

## 2018-08-27 MED ORDER — OXYTOCIN 40 UNITS IN NORMAL SALINE INFUSION - SIMPLE MED: 1.0000 m[IU]/min | INTRAVENOUS | Status: DC

## 2018-08-27 MED ORDER — FERROUS SULFATE 325 (65 FE) MG PO TABS
325.0000 mg | ORAL_TABLET | Freq: Every day | ORAL | Status: DC
  Administered 2018-08-28 – 2018-08-29 (×2): 325 mg via ORAL
  Filled 2018-08-27 (×2): qty 1

## 2018-08-27 MED ORDER — OXYTOCIN 40 UNITS IN NORMAL SALINE INFUSION - SIMPLE MED
2.5000 [IU]/h | INTRAVENOUS | Status: DC
  Administered 2018-08-27: 2.5 [IU]/h via INTRAVENOUS

## 2018-08-27 MED ORDER — LIDOCAINE HCL (PF) 1 % IJ SOLN
30.0000 mL | INTRAMUSCULAR | Status: AC | PRN
  Administered 2018-08-27: 30 mL via SUBCUTANEOUS

## 2018-08-27 MED ORDER — SIMETHICONE 80 MG PO CHEW: 80.0000 mg | CHEWABLE_TABLET | ORAL | Status: DC | PRN

## 2018-08-27 MED ORDER — METHYLERGONOVINE MALEATE 0.2 MG/ML IJ SOLN
INTRAMUSCULAR | Status: AC
  Administered 2018-08-27: 0.2 mg
  Filled 2018-08-27: qty 1

## 2018-08-27 NOTE — Lactation Note (Signed)
This note was copied from a baby's chart. Lactation Consultation Note  Patient Name: Jeanette [name] [id number] Date: 08/27/2018 Reason for consult: Initial assessment;Primapara;1st time breastfeeding   Maternal Data Has patient been taught Hand Expression?: Yes(needs practice) Does the patient have breastfeeding experience prior to this delivery?: No  Feeding Feeding Type: Breast Fed Took several attempts to latch, baby sucking on tongue, able to latch with mom and baby in side lying position on both breasts, now nursing well on right breast with deep latch LATCH Score Latch: Repeated attempts needed to sustain latch, nipple held in mouth throughout feeding, stimulation needed to elicit sucking reflex.  Audible Swallowing: A few with stimulation  Type of Nipple: Everted at rest and after stimulation  Comfort (Breast/Nipple): Soft / non-tender  Hold (Positioning): Assistance needed to correctly position infant at breast and maintain latch.  LATCH Score: 7  Interventions Interventions: Breast feeding basics reviewed;Assisted with latch;Skin to skin;Hand express;Breast compression;Adjust position;Support pillows  Lactation Tools Discussed/Used WIC Program: No Mom has Medela pump and style that was given to her   Consult Status Consult Status: Follow-up Date: 08/28/18 Follow-up type: In-patient    Ferol Luz 08/27/2018, 6:18 PM

## 2018-08-27 NOTE — Discharge Summary (Signed)
Physician Obstetric Discharge Summary  Patient ID: Jeanette [name] MRN: [id number] DOB/AGE: 1990-07-20 28 y.o.   Date of Admission: 08/27/2018 Date of Delivery: 08/27/2018 Delivering provider: Dalia Heading, CNM Date of Discharge: 08/29/2018  Admitting Diagnosis: Onset of Labor at [redacted]w[redacted]d  Secondary Diagnosis: none  Mode of Delivery: normal spontaneous vaginal delivery 08/27/2018      Discharge Diagnosis: Term intrauterine pregnancy, delivered. second degree perineal laceration   Intrapartum Procedures: Atificial rupture of membranes and repair of second degree laceration   Post partum procedures: none  Complications: none   Brief Hospital Course  Jeanette [name] is a [id number] who had a SVD on 08/27/2018;  for further details of this delivery, please refer to the delivery note.  Patient had an uncomplicated postpartum course.  By time of discharge on PPD#2, her pain was controlled on oral pain medications; she had appropriate lochia and was ambulating, voiding without difficulty and tolerating regular diet. She reports breastfeeding is going well. She was deemed stable for discharge to home.    Labs: CBC Latest Ref Rng & Units 08/28/2018 08/27/2018 07/30/2018  WBC 4.0 - 10.5 K/uL 16.5(H) 14.5(H) 9.9  Hemoglobin 12.0 - 15.0 g/dL 11.4(L) 12.1 11.6  Hematocrit 36.0 - 46.0 % 33.4(L) 35.2(L) 34.7  Platelets 150 - 400 K/uL 160 200 208   A NEG Performed at [facility], [address] [place], [place] [address]   Physical exam:  Blood pressure 119/69, pulse 67, temperature 98.2 F (36.8 C), temperature source Oral, resp. rate 20, height 5\' 5"  (1.651 m), weight 76.2 kg, last menstrual period 11/16/2017, SpO2 99 %, unknown if currently breastfeeding. General: alert and no distress Lochia: appropriate Abdomen: soft, NT Uterine Fundus: firm Incision: NA Extremities: No evidence of DVT seen on physical exam. No lower extremity edema.  Discharge Instructions: Per After Visit  Summary. Activity: Advance as tolerated. Pelvic rest for 6 weeks.  Also refer to Discharge Instructions Diet: Regular Medications: Allergies as of 08/29/2018   No Known Allergies     Medication List    TAKE these medications   ferrous sulfate 325 (65 FE) MG tablet Take 325 mg by mouth daily with breakfast.   Prenatal Gummies/DHA & FA 0.4-32.5 MG Chew Chew by mouth.   vitamin B-12 100 MCG tablet Commonly known as: CYANOCOBALAMIN Take 100 mcg by mouth daily.      Outpatient follow up:  Follow-up Information    Dalia Heading, CNM. Schedule an appointment as soon as possible for a visit. Birth control consult.  Specialty: Certified Nurse Midwife Why: for 6 week postpartum visit Contact information: [address] [place] [address] [phone]          Postpartum contraception: undecided  Discharged Condition: good  Discharged to: home   Newborn Data: Reginold Agent Disposition:home with mother  Apgars: APGAR (1 MIN): 9   APGAR (5 MINS): 9   APGAR (10 MINS):    Baby Feeding: Breast  Rod Can, CNM 08/29/2018 11:56 AM

## 2018-08-27 NOTE — Discharge Instructions (Signed)
Discharge Instructions:   Follow-up Appointment: Schedule your follow-up postpartum appointment for a visit in 6 weeks! Call [phone].  If there are any new medications, they have been ordered and will be available for pickup at the listed pharmacy on your way home from the hospital.   Call office if you have any of the following: headache, visual changes, fever >101.0 F, chills, shortness of breath, breast concerns, excessive vaginal bleeding, incision drainage or problems, leg pain or redness, depression or any other concerns. If you have vaginal discharge with an odor, let your doctor know.   It is normal to bleed for up to 6 weeks. You should not soak through more than 1 pad in 1 hour. If you have a blood clot larger than your fist with continued bleeding, call your doctor.   Activity: Do not lift > 10 lbs for 6 weeks (do not lift anything heavier than your baby). No intercourse, tampons, swimming pools, hot tubs, baths (only showers) for 6 weeks.  No driving for 1-2 weeks. Continue prenatal vitamin, especially if breastfeeding. Increase calories and fluids (water) while breastfeeding.   Your milk will come in, in the next couple of days (right now it is colostrum). You may have a slight fever when your milk comes in, but it should go away on its own.  If it does not, and rises above 101 F please call the doctor. You will also feel achy and your breasts will be firm. They will also start to leak. If you are breastfeeding, continue as you have been and you can pump/express milk for comfort.   If you have too much milk, your breasts can become engorged, which could lead to mastitis. This is an infection of the milk ducts. It can be very painful and you will need to notify your doctor to obtain a prescription for antibiotics. You can also treat it with a shower or hot/cold compress.   For concerns about your baby, please call your pediatrician.  For breastfeeding concerns, the lactation  consultant can be reached at [phone].   Postpartum blues (feelings of happy one minute and sad another minute) are normal for the first few weeks but if it gets worse let your doctor know.   Congratulations! We enjoyed caring for you and your new bundle of joy!      Vaginal Delivery, Care After Refer to this sheet in the next few weeks. These discharge instructions provide you with information on caring for yourself after delivery. Your caregiver may also give you specific instructions. Your treatment has been planned according to the most current medical practices available, but problems sometimes occur. Call your caregiver if you have any problems or questions after you go home. HOME CARE INSTRUCTIONS 1. Take over-the-counter or prescription medicines only as directed by your caregiver or pharmacist. 2. Do not drink alcohol, especially if you are breastfeeding or taking medicine to relieve pain. 3. Do not smoke tobacco. 4. Continue to use good perineal care. Good perineal care includes: 1. Wiping your perineum from back to front 2. Keeping your perineum clean. 3. You can do sitz baths twice a day, to help keep this area clean 5. Do not use tampons, douche or have sex for 6 weeks 6. Shower only and avoid sitting in submerged water, aside from sitz baths 7. Wear a well-fitting bra that provides breast support. 8. Eat healthy foods. 9. Drink enough fluids to keep your urine clear or pale yellow. 10. Eat high-fiber foods such as  whole grain cereals and breads, brown rice, beans, and fresh fruits and vegetables every day. These foods may help prevent or relieve constipation. 11. Avoid constipation with high fiber foods or medications, such as miralax or metamucil 12. Follow your caregiver's recommendations regarding resumption of activities such as climbing stairs, driving, lifting, exercising, or traveling. 29. Talk to your caregiver about resuming sexual activities. Resumption of  sexual activities after 6 weeks is dependent upon your risk of infection, your rate of healing, and your comfort and desire to resume sexual activity. 14. Try to have someone help you with your household activities and your newborn for at least a few days after you leave the hospital. 15. Rest as much as possible. Try to rest or take a nap when your newborn is sleeping. 16. Increase your activities gradually. 107. Keep all of your scheduled postpartum appointments. It is very important to keep your scheduled follow-up appointments. At these appointments, your caregiver will be checking to make sure that you are healing physically and emotionally. SEEK MEDICAL CARE IF:   You are passing large clots from your vagina. Save any clots to show your caregiver.  You have a foul smelling discharge from your vagina.  You have trouble urinating.  You are urinating frequently.  You have pain when you urinate.  You have a change in your bowel movements.  You have increasing redness, pain, or swelling near your vaginal incision (episiotomy) or vaginal tear.  You have pus draining from your episiotomy or vaginal tear.  Your episiotomy or vaginal tear is separating.  You have painful, hard, or reddened breasts.  You have a severe headache.  You have blurred vision or see spots.  You feel sad or depressed.  You have thoughts of hurting yourself or your newborn.  You have questions about your care, the care of your newborn, or medicines.  You are dizzy or light-headed.  You have a rash.  You have nausea or vomiting.  You were breastfeeding and have not had a menstrual period within 12 weeks after you stopped breastfeeding.  You are not breastfeeding and have not had a menstrual period by the 12th week after delivery.  You have a fever of 100.5 or more SEEK IMMEDIATE MEDICAL CARE IF:   You have persistent pain.  You have chest pain.  You have shortness of breath.  You  faint.  You have leg pain.  You have stomach pain.  Your vaginal bleeding saturates two or more sanitary pads in 1 hour. MAKE SURE YOU:   Understand these instructions.  Will watch your condition.  Will get help right away if you are not doing well or get worse. Document Released: 12/21/1999 Document Revised: 05/09/2013 Document Reviewed: 08/20/2011 [facility] Patient Information 2015 [place], [place]. This information is not intended to replace advice given to you by your health care provider. Make sure you discuss any questions you have with your health care provider.  Sitz Bath A sitz bath is a warm water bath taken in the sitting position. The water covers only the hips and butt (buttocks). We recommend using one that fits in the toilet, to help with ease of use and cleanliness. It may be used for either healing or cleaning purposes. Sitz baths are also used to relieve pain, itching, or muscle tightening (spasms). The water may contain medicine. Moist heat will help you heal and relax.  HOME CARE  Take 3 to 4 sitz baths a day. 18. Fill the bathtub half-full with warm  water. 19. Sit in the water and open the drain a little. 20. Turn on the warm water to keep the tub half-full. Keep the water running constantly. 21. Soak in the water for 15 to 20 minutes. 22. After the sitz bath, pat the affected area dry. GET HELP RIGHT AWAY IF: You get worse instead of better. Stop the sitz baths if you get worse. MAKE SURE YOU:  Understand these instructions.  Will watch your condition.  Will get help right away if you are not doing well or get worse. Document Released: 01/31/2004 Document Revised: 09/17/2011 Document Reviewed: 04/22/2010 [facility] Patient Information 2015 [place], [place]. This information is not intended to replace advice given to you by your health care provider. Make sure you discuss any questions you have with your health care provider.

## 2018-08-27 NOTE — OB Triage Note (Signed)
Ctx starting this am @ 0100, becoming more frequent throughout the day. Reports good fetal movement. Denies vaginal bleeding, gush of fluid. Dineen Kid

## 2018-08-27 NOTE — Progress Notes (Signed)
Date of Initial H&P: 08/23/2018 for IOL scheduled 08/30/2018  Jeanette [name] is a G1P0 female with EDC= 08/23/2018 who presented at [facility] 4days in labor.  She reported that her contractions started at 0100 this Am and became regular and more intense at 1145 when they were every 3 minutes apart. +bloody show. No frank bleeding or leakage of fluid.   Her pregnancy has been uncomplicated and has also been remarkable for :  [facility] Prenatal Labs  Dating  LMP =8wk [place] Blood type: A/Negative/-- (01/10 0945)   Genetic Screen none Antibody:Negative (05/29 0930)  Anatomic [place] Normal anatomy, anterior placenta Rubella: 7.43 (01/10 0945)  Varicella: Immune  GTT 28 wk: 82     RPR: Non Reactive (05/29 0930)   Rhogam 06/04/18 HBsAg: Negative (01/10 0945)   TDaP vaccine  06/18/2018                      HIV: Non Reactive (05/29 0930)   Flu Shot   09/2017                             [id number] (07/24 1515)  Contraception   Unsure; no IUD or Nexplanon Pap: NIL 2020  CBB  no   CS/VBAC n/a   Baby Food  Breast   Support Person    HUsband    Total weight gain 50#. EFW at 32 weeks 5 days was 4#7oz. She is A negative and received Rhogam on 06/04/2018.   Exam: General: calm and in NAD Vital signs: BP 139/74 (BP Location: Right Arm)   Pulse 76   Temp 98.4 F (36.9 C) (Oral)   Resp 16   Ht 5\' 5"  (1.651 m)   Wt 76.2 kg   LMP 11/16/2017 (Exact Date)   BMI 27.96 kg/m   Neck: no thyroid enlargement or nodules Chest/Lungs: CTAB/ normal respiratory effort Heart: RRR without murmur Abdomen: cephalic, EFW [id number] oz  FHR: 150 baseline with accelerations to 170s, moderate variability. Toco: contractions 1 to 3 minutes apart with some coupling.  Cervix: 7/C/-1 per RN exam. Extremities: no edema, no redness, no tenderness Neuro: alert, awake, oriented, DTRs +1  A: IUP at 40wk 4 days in active labor RH negative FWB: Cat 1  P: Admitted to L&D-anticipate vaginal delivery Covid 19 testing done CBC, RPR, T&S  sent Clear liquids Monitor progress and maternal fetal well being Would like to try to deliver without epidural A neg/ RI/ VI Breast TDAP 06/18/2018 Contraception: undecided   Dalia Heading, CNM

## 2018-08-28 LAB — CBC
HCT: 33.4 % — ABNORMAL LOW (ref 36.0–46.0)
Hemoglobin: 11.4 g/dL — ABNORMAL LOW (ref 12.0–15.0)
MCH: 30.1 pg (ref 26.0–34.0)
MCHC: 34.1 g/dL (ref 30.0–36.0)
MCV: 88.1 fL (ref 80.0–100.0)
Platelets: 160 10*3/uL (ref 150–400)
RBC: 3.79 MIL/uL — ABNORMAL LOW (ref 3.87–5.11)
RDW: 12.6 % (ref 11.5–15.5)
WBC: 16.5 10*3/uL — ABNORMAL HIGH (ref 4.0–10.5)
nRBC: 0 % (ref 0.0–0.2)

## 2018-08-28 LAB — BPAM RBC
Blood Product Expiration Date: 202009162359
Blood Product Expiration Date: 202009202359
Unit Type and Rh: 600
Unit Type and Rh: 600

## 2018-08-28 LAB — TYPE AND SCREEN
ABO/RH(D): A NEG
Antibody Screen: POSITIVE
Unit division: 0
Unit division: 0

## 2018-08-28 LAB — RPR: RPR Ser Ql: NONREACTIVE

## 2018-08-28 MED ORDER — IBUPROFEN 600 MG PO TABS
600.0000 mg | ORAL_TABLET | Freq: Four times a day (QID) | ORAL | Status: DC
  Administered 2018-08-28 – 2018-08-29 (×2): 600 mg via ORAL
  Filled 2018-08-28 (×3): qty 1

## 2018-08-28 NOTE — Progress Notes (Signed)
Subjective:  Doing well postpartum day 1: she is tolerating regular diet and her pain is controlled with PO medication. She is ambulating and voiding without difficulty. She reports breastfeeding is going well especially on the right side and she will call for [facility] for help with the left side.   Objective:  Vital signs in last 24 hours: Temp:  [98 F (36.7 C)-98.5 F (36.9 C)] 98.2 F (36.8 C) (08/22 0756) Pulse Rate:  [64-96] 73 (08/22 0756) Resp:  [16-18] 18 (08/22 0756) BP: (110-147)/(54-82) 112/69 (08/22 0756) SpO2:  [97 %-99 %] 99 % (08/22 0756) Weight:  [76.2 kg] 76.2 kg (08/21 1241)    General: NAD Pulmonary: no increased work of breathing Abdomen: non-distended, non-tender, fundus firm at level of umbilicus Extremities: no edema, no erythema, no tenderness  Results for orders placed or performed during the hospital encounter of 08/27/18 (from the past 72 hour(s))  SARS Coronavirus 2 [facility] order, Performed in [facility] hospital lab) Nasopharyngeal Nasopharyngeal Swab     Status: None   Collection Time: 08/27/18  1:10 PM   Specimen: Nasopharyngeal Swab  Result Value Ref Range   SARS Coronavirus 2 NEGATIVE NEGATIVE    Comment: (NOTE) If result is NEGATIVE SARS-CoV-2 target nucleic acids are NOT DETECTED. The SARS-CoV-2 RNA is generally detectable in upper and lower  respiratory specimens during the acute phase of infection. The lowest  concentration of SARS-CoV-2 viral copies this assay can detect is 250  copies / mL. A negative result does not preclude SARS-CoV-2 infection  and should not be used as the sole basis for treatment or other  patient management decisions.  A negative result may occur with  improper specimen collection / handling, submission of specimen other  than nasopharyngeal swab, presence of viral mutation(s) within the  areas targeted by this assay, and inadequate number of viral copies  (<250 copies / mL). A negative result must be combined with  clinical  observations, patient history, and epidemiological information. If result is POSITIVE SARS-CoV-2 target nucleic acids are DETECTED. The SARS-CoV-2 RNA is generally detectable in upper and lower  respiratory specimens dur ing the acute phase of infection.  Positive  results are indicative of active infection with SARS-CoV-2.  Clinical  correlation with patient history and other diagnostic information is  necessary to determine patient infection status.  Positive results do  not rule out bacterial infection or co-infection with other viruses. If result is PRESUMPTIVE POSTIVE SARS-CoV-2 nucleic acids MAY BE PRESENT.   A presumptive positive result was obtained on the submitted specimen  and confirmed on repeat testing.  While 2019 novel coronavirus  (SARS-CoV-2) nucleic acids may be present in the submitted sample  additional confirmatory testing may be necessary for epidemiological  and / or clinical management purposes  to differentiate between  SARS-CoV-2 and other Sarbecovirus currently known to infect humans.  If clinically indicated additional testing with an alternate test  methodology [phone]) is advised. The SARS-CoV-2 RNA is generally  detectable in upper and lower respiratory sp ecimens during the acute  phase of infection. The expected result is Negative. Fact Sheet for Patients:  StrictlyIdeas.no Fact Sheet for Healthcare Providers: BankingDealers.co.za This test is not yet approved or cleared by the [place] FDA and has been authorized for detection and/or diagnosis of SARS-CoV-2 by FDA under an Emergency Use Authorization (EUA).  This EUA will remain in effect (meaning this test can be used) for the duration of the COVID-19 declaration under Section 564(b)(1) of the  Act, 21 U.S.C. section 360bbb-3(b)(1), unless the authorization is terminated or revoked sooner. Performed at [facility], [address] [place], [place] [address]   CBC     Status: Abnormal   Collection Time: 08/27/18  1:31 PM  Result Value Ref Range   WBC 14.5 (H) 4.0 - 10.5 K/uL   RBC 4.03 3.87 - 5.11 MIL/uL   Hemoglobin 12.1 12.0 - 15.0 g/dL   HCT 35.2 (L) 36.0 - 46.0 %   MCV 87.3 80.0 - 100.0 fL   MCH 30.0 26.0 - 34.0 pg   MCHC 34.4 30.0 - 36.0 g/dL   RDW 12.5 11.5 - 15.5 %   Platelets 200 150 - 400 K/uL   nRBC 0.0 0.0 - 0.2 %    Comment: Performed at [facility], [address] [place], [place] [address]  RPR     Status: None   Collection Time: 08/27/18  1:31 PM  Result Value Ref Range   RPR Ser Ql Non Reactive Non Reactive    Comment: (NOTE) Performed At: [facility] [place], [place] [id number] Rush Farmer MD [id number]   Type and screen     Status: None   Collection Time: 08/27/18  1:31 PM  Result Value Ref Range   ABO/RH(D) A NEG    Antibody Screen POS    Sample Expiration 08/30/2018,2359    Antibody Identification PASSIVELY ACQUIRED ANTI-D    Unit Number [id number]    Blood Component Type RED CELLS,LR    Unit division 00    Status of Unit REL FROM [facility]    Transfusion Status OK TO TRANSFUSE    Crossmatch Result COMPATIBLE    Unit Number [id number]    Blood Component Type RED CELLS,LR    Unit division 00    Status of Unit REL FROM [facility]    Transfusion Status OK TO TRANSFUSE    Crossmatch Result COMPATIBLE   ABO/Rh     Status: None   Collection Time: 08/27/18  9:16 PM  Result Value Ref Range   ABO/RH(D)      A NEG Performed at [facility], [place], [place] [address]   CBC     Status: Abnormal   Collection Time: 08/28/18  6:07 AM  Result Value Ref Range   WBC 16.5 (H) 4.0 - 10.5 K/uL   RBC 3.79 (L) 3.87 - 5.11 MIL/uL   Hemoglobin 11.4 (L) 12.0 - 15.0 g/dL   HCT 33.4 (L) 36.0 - 46.0 %   MCV 88.1 80.0 - 100.0 fL   MCH 30.1 26.0 - 34.0 pg   MCHC 34.1 30.0 - 36.0 g/dL   RDW 12.6 11.5 - 15.5 %   Platelets 160  150 - 400 K/uL   nRBC 0.0 0.0 - 0.2 %    Comment: Performed at [facility], [address], [place], [place] [address]    Assessment:   28 y.o. G1P1001 postpartum day # 1  Plan:    1) Acute blood loss anemia - hemodynamically stable and asymptomatic - po ferrous sulfate  2) Blood Type A negative- baby is also A negative- Rhogam not indicated  3) Rubella Immune, Varicella Immune  4) TDAP status up to date  5) Feeding plan breast  6)  Education given regarding options for contraception, as well as compatibility with breast feeding if applicable.  Patient is undecided at this time which form she will use for contraception.  6) Disposition: continue routine care-  she may want to go home later today pending NB testing   Rod Can, [facility] 08/28/2018, 11:17 AM

## 2018-08-28 NOTE — Lactation Note (Addendum)
This note was copied from a baby's chart. Lactation Consultation Note  Patient Name: Jeanette [name] [id number] Date: 08/28/2018 Reason for consult: Follow-up assessment  Assisted mom with comfortable position with pillow support in modified cross cradle hold on right breast.  Reginold Agent has been preferring right breast over left breast.  Both breasts have firm areola with smaller nipple on right. The left breast, which Reginold Agent does not prefer, is more everted than the right.  Can hand express colostrum from both breasts.  After several attempts, we were able to achieve deep enough latch for her to sustain breast in her mouth and continue with rhythmic sucking.  Pointed out swallows to mom.  Mom denies any breast or nipple pain.  Nipples are slightly dry.  Coconut oil given and instructed in use.  Discussed what to look for with feeding cues and encouraged mom to put Josephine to the breast whenever she demonstrated hunger cues.  Explained newborn stomach size, supply and demand, normal course of lactation and routine newborn feeding patterns.  Lactation name and number written on white board and encouraged to call with any questions, concerns or assistance.  Maternal Data Formula Feeding for Exclusion: No Has patient been taught Hand Expression?: Yes Does the patient have breastfeeding experience prior to this delivery?: No(1st baby)  Feeding Feeding Type: Breast Fed  LATCH Score Latch: Repeated attempts needed to sustain latch, nipple held in mouth throughout feeding, stimulation needed to elicit sucking reflex.  Audible Swallowing: A few with stimulation  Type of Nipple: Everted at rest and after stimulation  Comfort (Breast/Nipple): Soft / non-tender  Hold (Positioning): Assistance needed to correctly position infant at breast and maintain latch.  LATCH Score: 7  Interventions Interventions: Assisted with latch;Breast massage;Hand express;Reverse pressure;Breast compression;Adjust  position;Support pillows;Position options;Coconut oil  Lactation Tools Discussed/Used WIC Program: [employer])   Consult Status Consult Status: Follow-up Date: 08/28/18 Follow-up type: Call as needed    Jarold Motto 08/28/2018, 2:25 PM

## 2018-08-29 NOTE — Progress Notes (Signed)
Discharge instructions given. Patient verbalizes understanding of teaching. Patient discharged home via wheelchair at 1500.

## 2018-10-13 ENCOUNTER — Other Ambulatory Visit: Payer: Self-pay

## 2018-10-13 ENCOUNTER — Encounter: Payer: Self-pay | Admitting: Certified Nurse Midwife

## 2018-10-13 ENCOUNTER — Ambulatory Visit (INDEPENDENT_AMBULATORY_CARE_PROVIDER_SITE_OTHER): Payer: 59 | Admitting: Certified Nurse Midwife

## 2018-10-13 VITALS — BP 90/60 | HR 88 | Ht 65.0 in | Wt 142.8 lb

## 2018-10-13 DIAGNOSIS — Z1389 Encounter for screening for other disorder: Secondary | ICD-10-CM

## 2018-10-13 DIAGNOSIS — Z23 Encounter for immunization: Secondary | ICD-10-CM | POA: Diagnosis not present

## 2018-10-13 NOTE — Progress Notes (Signed)
Postpartum Visit  Chief Complaint:  Chief Complaint  Patient presents with  . Postpartum Care    flu shot    History of Present Illness: Jeanette [name] is a 28 y.o. WF, G1P1001, who presents for her 6 week postpartum visit.  Date of delivery: 08/27/2018 Type of delivery: Vaginal delivery - Vacuum or forceps assisted  no Episiotomy No.  Laceration: yes, second degree perineal laceration Pregnancy or labor problems: Variable decelerations in labor with nuchal cord. Mild uterine atony Any problems since the delivery:  No, stopped bleeding 2-3 weeks ago, baby breast feeding frequently and growing appropriately  Newborn Details:  SINGLETON :  1. Baby's name: Reginold Agent. Birth weight: 6#6.5oz Maternal Details:  Breast Feeding:  yes Post partum depression/anxiety noted:  Some postpartum anxiety is noted [place] Post-Partum Depression Score:  7  Date of last PAP: 01/2018  normal   Review of Systems: ROS  Past Medical History:  Past Medical History:  Diagnosis Date  . Type A blood, Rh negative   . UTI (urinary tract infection)     Past Surgical History:  Past Surgical History:  Procedure Laterality Date  . NO PAST SURGERIES      Family History:  Family History  Problem Relation Age of Onset  . Hypertension Father   . Hypertension Maternal Grandfather   . Thyroid disease Maternal Grandfather   . Diabetes Paternal Grandfather   . Colon cancer Other 63    Social History:  Social History   Socioeconomic History  . Marital status: Married    Spouse name: Not on file  . Number of children: 1  . Years of education: Not on file  . Highest education level: Not on file  Occupational History  . Not on file  Social Needs  . Financial resource strain: Not on file  . Food insecurity    Worry: Not on file    Inability: Not on file  . Transportation needs    Medical: Not on file    Non-medical: Not on file  Tobacco Use  . Smoking status: Never Smoker  . Smokeless  tobacco: Never Used  Substance and Sexual Activity  . Alcohol use: Not Currently    Frequency: Never    Comment: occasionally  . Drug use: No  . Sexual activity: Not Currently  Lifestyle  . Physical activity    Days per week: Not on file    Minutes per session: Not on file  . Stress: Not on file  Relationships  . Social Herbalist on phone: Not on file    Gets together: Not on file    Attends religious service: Not on file    Active member of club or organization: Not on file    Attends meetings of clubs or organizations: Not on file    Relationship status: Not on file  . Intimate partner violence    Fear of current or ex partner: Not on file    Emotionally abused: Not on file    Physically abused: Not on file    Forced sexual activity: Not on file  Other Topics Concern  . Not on file  Social History Narrative   ** Merged History Encounter **        Allergies:  No Known Allergies  Medications: Prior to Admission medications   Medication Sig Start Date End Date Taking? Authorizing Provider  ferrous sulfate 325 (65 FE) MG tablet Take 325 mg by mouth daily with breakfast.  Yes [provider]  Prenatal MV-Min-FA-Omega-3 (PRENATAL GUMMIES/DHA & FA) 0.4-32.5 MG CHEW Chew by mouth.   Yes [provider]  vitamin B-12 (CYANOCOBALAMIN) 100 MCG tablet Take 100 mcg by mouth daily.   Yes [provider]    Physical Exam Vitals: BP 90/60   Pulse 88   Ht 5\' 5"  (1.651 m)   Wt 142 lb 12.8 oz (64.8 kg)   LMP  (LMP Unknown)   BMI 23.76 kg/m  General: WF in NAD HEENT: normocephalic, anicteric Neck: No thyroid enlargement, no palpable nodules, no cervical lymphadenpathy Breast: Lactating, no inflammation, no masses, nipples intact Pulmonary: No increased work of breathing, CTAB Heart: RRR without murmur Abdomen: Soft, non-tender, non-distended.  Umbilicus without lesions.  No hepatomegaly or masses palpable. No evidence of hernia.  Genitourinary:  External: Well healed perineum, no lesions or inflammation    Vagina: Normal vaginal mucosa, no evidence of prolapse.    Cervix: closed,  no bleeding  Uterus: Well involuted, mobile, non-tender  Adnexa: No adnexal masses, non-tender  Rectal: deferred Extremities: no edema, erythema, or tenderness Neurologic: Grossly intact Psychiatric: mood appropriate, affect full  Assessment: 28 y.o. G1P1001 presenting for 6 week postpartum visit-normal involution  Plan:  1) Contraception: Patient would like to use LAM for contraception.  2)  Pap not due  3) Patient underwent screening for postpartum depression with no concerns noted.  4) Discussed return to normal activity, recommend continuing prenatal vitamins.  5) Follow up 1 year for routine annual exam and prn  Dalia Heading, CNM

## 2019-06-24 ENCOUNTER — Other Ambulatory Visit: Payer: Self-pay

## 2019-06-24 ENCOUNTER — Ambulatory Visit (INDEPENDENT_AMBULATORY_CARE_PROVIDER_SITE_OTHER): Payer: 59 | Admitting: Internal Medicine

## 2019-06-24 ENCOUNTER — Encounter: Payer: Self-pay | Admitting: Internal Medicine

## 2019-06-24 VITALS — BP 103/61 | HR 63 | Ht 65.0 in | Wt 122.1 lb

## 2019-06-24 DIAGNOSIS — Z0000 Encounter for general adult medical examination without abnormal findings: Secondary | ICD-10-CM | POA: Diagnosis not present

## 2019-06-24 NOTE — Patient Instructions (Addendum)
Health Maintenance, Female Adopting a healthy lifestyle and getting preventive care are important in promoting health and wellness. Ask your health care provider about:  The right schedule for you to have regular tests and exams.  Things you can do on your own to prevent diseases and keep yourself healthy. What should I know about diet, weight, and exercise? Eat a healthy diet   Eat a diet that includes plenty of vegetables, fruits, low-fat dairy products, and lean protein.  Do not eat a lot of foods that are high in solid fats, added sugars, or sodium. Maintain a healthy weight Body mass index (BMI) is used to identify weight problems. It estimates body fat based on height and weight. Your health care provider can help determine your BMI and help you achieve or maintain a healthy weight. Get regular exercise Get regular exercise. This is one of the most important things you can do for your health. Most adults should:  Exercise for at least 150 minutes each week. The exercise should increase your heart rate and make you sweat (moderate-intensity exercise).  Do strengthening exercises at least twice a week. This is in addition to the moderate-intensity exercise.  Spend less time sitting. Even light physical activity can be beneficial. Watch cholesterol and blood lipids Have your blood tested for lipids and cholesterol at 27 years of age, then have this test every 5 years. Have your cholesterol levels checked more often if:  Your lipid or cholesterol levels are high.  You are older than 27 years of age.  You are at high risk for heart disease. What should I know about cancer screening? Depending on your health history and family history, you may need to have cancer screening at various ages. This may include screening for:  Breast cancer.  Cervical cancer.  Colorectal cancer.  Skin cancer.  Lung cancer. What should I know about heart disease, diabetes, and high blood  pressure? Blood pressure and heart disease  High blood pressure causes heart disease and increases the risk of stroke. This is more likely to develop in people who have high blood pressure readings, are of African descent, or are overweight.  Have your blood pressure checked: ? Every 3-5 years if you are 18-39 years of age. ? Every year if you are 40 years old or older. Diabetes Have regular diabetes screenings. This checks your fasting blood sugar level. Have the screening done:  Once every three years after age 40 if you are at a normal weight and have a low risk for diabetes.  More often and at a younger age if you are overweight or have a high risk for diabetes. What should I know about preventing infection? Hepatitis B If you have a higher risk for hepatitis B, you should be screened for this virus. Talk with your health care provider to find out if you are at risk for hepatitis B infection. Hepatitis C Testing is recommended for:  Everyone born from 1945 through 1965.  Anyone with known risk factors for hepatitis C. Sexually transmitted infections (STIs)  Get screened for STIs, including gonorrhea and chlamydia, if: ? You are sexually active and are younger than 27 years of age. ? You are older than 27 years of age and your health care provider tells you that you are at risk for this type of infection. ? Your sexual activity has changed since you were last screened, and you are at increased risk for chlamydia or gonorrhea. Ask your health care provider if   you are at risk.  Ask your health care provider about whether you are at high risk for HIV. Your health care provider may recommend a prescription medicine to help prevent HIV infection. If you choose to take medicine to prevent HIV, you should first get tested for HIV. You should then be tested every 3 months for as long as you are taking the medicine. Pregnancy  If you are about to stop having your period (premenopausal) and  you may become pregnant, seek counseling before you get pregnant.  Take 400 to 800 micrograms (mcg) of folic acid every day if you become pregnant.  Ask for birth control (contraception) if you want to prevent pregnancy. Osteoporosis and menopause Osteoporosis is a disease in which the bones lose minerals and strength with aging. This can result in bone fractures. If you are 65 years old or older, or if you are at risk for osteoporosis and fractures, ask your health care provider if you should:  Be screened for bone loss.  Take a calcium or vitamin D supplement to lower your risk of fractures.  Be given hormone replacement therapy (HRT) to treat symptoms of menopause. Follow these instructions at home: Lifestyle  Do not use any products that contain nicotine or tobacco, such as cigarettes, e-cigarettes, and chewing tobacco. If you need help quitting, ask your health care provider.  Do not use street drugs.  Do not share needles.  Ask your health care provider for help if you need support or information about quitting drugs. Alcohol use  Do not drink alcohol if: ? Your health care provider tells you not to drink. ? You are pregnant, may be pregnant, or are planning to become pregnant.  If you drink alcohol: ? Limit how much you use to 0-1 drink a day. ? Limit intake if you are breastfeeding.  Be aware of how much alcohol is in your drink. In the [place] one drink equals one 12 oz bottle of beer (355 mL), one 5 oz glass of wine (148 mL), or one 1 oz glass of hard liquor (44 mL). General instructions  Schedule regular health, dental, and eye exams.  Stay current with your vaccines.  Tell your health care provider if: ? You often feel depressed. ? You have ever been abused or do not feel safe at home. Summary  Adopting a healthy lifestyle and getting preventive care are important in promoting health and wellness.  Follow your health care provider's instructions about healthy  diet, exercising, and getting tested or screened for diseases.  Follow your health care provider's instructions on monitoring your cholesterol and blood pressure. This information is not intended to replace advice given to you by your health care provider. Make sure you discuss any questions you have with your health care provider. Document Revised: 12/16/2017 Document Reviewed: 12/16/2017 Elsevier Patient Education  2020 Else[employer] Inc[employer]  Health Maintenance, Female Adopting a healthy lifestyle and getting preventive care are important in promoting health and wellness. Ask your health care provider about:  The right schedule for you to have regular tests and exams.  Things you can do on your own to prevent diseases and keep yourself healthy. What should I know about diet, weight, and exercise? Eat a healthy diet   Eat a diet that includes plenty of vegetables, fruits, low-fat dairy products, and lean protein.  Do not eat a lot of foods that are high in solid fats, added sugars, or sodium. Maintain a healthy weight Body mass index (BMI) is   used to identify weight problems. It estimates body fat based on height and weight. Your health care provider can help determine your BMI and help you achieve or maintain a healthy weight. Get regular exercise Get regular exercise. This is one of the most important things you can do for your health. Most adults should:  Exercise for at least 150 minutes each week. The exercise should increase your heart rate and make you sweat (moderate-intensity exercise).  Do strengthening exercises at least twice a week. This is in addition to the moderate-intensity exercise.  Spend less time sitting. Even light physical activity can be beneficial. Watch cholesterol and blood lipids Have your blood tested for lipids and cholesterol at 27 years of age, then have this test every 5 years. Have your cholesterol levels checked more often if:  Your lipid or cholesterol  levels are high.  You are older than 27 years of age.  You are at high risk for heart disease. What should I know about cancer screening? Depending on your health history and family history, you may need to have cancer screening at various ages. This may include screening for:  Breast cancer.  Cervical cancer.  Colorectal cancer.  Skin cancer.  Lung cancer. What should I know about heart disease, diabetes, and high blood pressure? Blood pressure and heart disease  High blood pressure causes heart disease and increases the risk of stroke. This is more likely to develop in people who have high blood pressure readings, are of African descent, or are overweight.  Have your blood pressure checked: ? Every 3-5 years if you are 18-39 years of age. ? Every year if you are 40 years old or older. Diabetes Have regular diabetes screenings. This checks your fasting blood sugar level. Have the screening done:  Once every three years after age 40 if you are at a normal weight and have a low risk for diabetes.  More often and at a younger age if you are overweight or have a high risk for diabetes. What should I know about preventing infection? Hepatitis B If you have a higher risk for hepatitis B, you should be screened for this virus. Talk with your health care provider to find out if you are at risk for hepatitis B infection. Hepatitis C Testing is recommended for:  Everyone born from 1945 through 1965.  Anyone with known risk factors for hepatitis C. Sexually transmitted infections (STIs)  Get screened for STIs, including gonorrhea and chlamydia, if: ? You are sexually active and are younger than 27 years of age. ? You are older than 27 years of age and your health care provider tells you that you are at risk for this type of infection. ? Your sexual activity has changed since you were last screened, and you are at increased risk for chlamydia or gonorrhea. Ask your health care  provider if you are at risk.  Ask your health care provider about whether you are at high risk for HIV. Your health care provider may recommend a prescription medicine to help prevent HIV infection. If you choose to take medicine to prevent HIV, you should first get tested for HIV. You should then be tested every 3 months for as long as you are taking the medicine. Pregnancy  If you are about to stop having your period (premenopausal) and you may become pregnant, seek counseling before you get pregnant.  Take 400 to 800 micrograms (mcg) of folic acid every day if you become pregnant.  Ask   for birth control (contraception) if you want to prevent pregnancy. Osteoporosis and menopause Osteoporosis is a disease in which the bones lose minerals and strength with aging. This can result in bone fractures. If you are 65 years old or older, or if you are at risk for osteoporosis and fractures, ask your health care provider if you should:  Be screened for bone loss.  Take a calcium or vitamin D supplement to lower your risk of fractures.  Be given hormone replacement therapy (HRT) to treat symptoms of menopause. Follow these instructions at home: Lifestyle  Do not use any products that contain nicotine or tobacco, such as cigarettes, e-cigarettes, and chewing tobacco. If you need help quitting, ask your health care provider.  Do not use street drugs.  Do not share needles.  Ask your health care provider for help if you need support or information about quitting drugs. Alcohol use  Do not drink alcohol if: ? Your health care provider tells you not to drink. ? You are pregnant, may be pregnant, or are planning to become pregnant.  If you drink alcohol: ? Limit how much you use to 0-1 drink a day. ? Limit intake if you are breastfeeding.  Be aware of how much alcohol is in your drink. In the [place] one drink equals one 12 oz bottle of beer (355 mL), one 5 oz glass of wine (148 mL), or one 1  oz glass of hard liquor (44 mL). General instructions  Schedule regular health, dental, and eye exams.  Stay current with your vaccines.  Tell your health care provider if: ? You often feel depressed. ? You have ever been abused or do not feel safe at home. Summary  Adopting a healthy lifestyle and getting preventive care are important in promoting health and wellness.  Follow your health care provider's instructions about healthy diet, exercising, and getting tested or screened for diseases.  Follow your health care provider's instructions on monitoring your cholesterol and blood pressure. This information is not intended to replace advice given to you by your health care provider. Make sure you discuss any questions you have with your health care provider. Document Revised: 12/16/2017 Document Reviewed: 12/16/2017 Elsevier Patient Education  2020 Elsevier Inc.  

## 2019-06-24 NOTE — Progress Notes (Signed)
Established Patient Office Visit  SUBJECTIVE:  Patient ID: Jeanette [name], female    DOB: 1990-03-09  Age: 29 y.o. MRN: [id number]  CC:  Chief Complaint  Patient presents with  . Annual Exam    HPI Jeanette [name] presents today for her annual exam.   She feels well overall and denies any issues.   She recently had a baby within the last year and she is still breast feeding, so she has not had a period since she became pregnant. 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    Past Medical History:  Diagnosis Date  . Supervision of normal first pregnancy 01/18/2018     Clinic [facility] Prenatal Labs Dating  LMP =8wk [place] Blood type: A/Negative/-- (01/10 0945)  Genetic Screen none Antibody:Negative (05/29 0930) Anatomic [place] Normal anatomy, anterior placenta Rubella: 7.43 (01/10 0945)  Varicella: Immune GTT 28 wk: 82     RPR: Non Reactive (05/29 0930)  Rhogam 06/04/18 HBsAg: Negative (01/10 0945)  TDaP vaccine  06/18/2018                      HIV: Non Reactive (05/29   . Type A blood, Rh negative   . UTI (urinary tract infection)     Past Surgical History:  Procedure Laterality Date  . NO PAST SURGERIES      Family History  Problem Relation Age of Onset  . Hypertension Father   . Hypertension Maternal Grandfather   . Thyroid disease Maternal Grandfather   . Diabetes Paternal Grandfather   . Colon cancer Other 7    Social History   Socioeconomic History  . Marital status: Married    Spouse name: Not on file  . Number of children: 1  . Years of education: Not on file  . Highest education level: Not on file  Occupational History    Employer: [employer]  Tobacco Use  . Smoking status: Never Smoker  . Smokeless tobacco: Never Used  Vaping Use  . Vaping Use: Never used  Substance and Sexual Activity  . Alcohol use: Not Currently    Comment: occasionally  . Drug use: No  . Sexual activity: Not Currently  Other Topics Concern  . Not on file   Social History Narrative   ** Merged History Encounter **       Social Determinants of Health   Financial Resource Strain:   . Difficulty of Paying Living Expenses:   Food Insecurity:   . Worried About Charity fundraiser in the Last Year:   . Arboriculturist in the Last Year:   Transportation Needs:   . Film/video editor (Medical):   Marland Kitchen Lack of Transportation (Non-Medical):   Physical Activity:   . Days of Exercise per Week:   . Minutes of Exercise per Session:   Stress:   . Feeling of Stress :   Social Connections:   . Frequency of Communication with Friends and Family:   . Frequency of Social Gatherings with Friends and Family:   . Attends Religious Services:   . Active Member of Clubs or Organizations:   . Attends Archivist Meetings:   Marland Kitchen Marital Status:   Intimate Partner Violence:   . Fear of Current or Ex-Partner:   . Emotionally Abused:   Marland Kitchen Physically Abused:   . Sexually Abused:      Current Outpatient Medications:  Marland Kitchen  Multiple Vitamin (  MULTIVITAMIN) tablet, Take 1 tablet by mouth daily., Disp: , Rfl:    No Known Allergies  ROS Review of Systems  Constitutional: Negative.   HENT: Positive for congestion (due to environmental allergies). Negative for trouble swallowing.   Eyes: Negative.   Respiratory: Negative.  Negative for shortness of breath.   Cardiovascular: Negative.  Negative for chest pain.  Gastrointestinal: Negative.  Negative for abdominal pain.  Endocrine: Negative.   Genitourinary: Negative.  Negative for menstrual problem and pelvic pain.  Musculoskeletal: Negative.  Negative for myalgias.  Skin: Negative.   Allergic/Immunologic: Positive for environmental allergies.  Neurological: Negative.   Hematological: Negative.   Psychiatric/Behavioral: Negative.   All other systems reviewed and are negative.     OBJECTIVE:    Physical Exam Vitals reviewed.  Constitutional:      Appearance: Normal appearance.  HENT:      Mouth/Throat:     Mouth: Mucous membranes are moist.  Eyes:     Pupils: Pupils are equal, round, and reactive to light.  Cardiovascular:     Rate and Rhythm: Normal rate and regular rhythm.     Pulses: Normal pulses.     Comments: midsystolic click present Pulmonary:     Effort: Pulmonary effort is normal.     Breath sounds: Normal breath sounds.  Abdominal:     Palpations: Abdomen is soft. There is no hepatomegaly, splenomegaly or mass.     Tenderness: There is no abdominal tenderness.  Musculoskeletal:     Right lower leg: No edema.     Left lower leg: No edema.  Skin:    Comments: Normal freckles  Neurological:     Mental Status: She is alert and oriented to person, place, and time.  Psychiatric:        Mood and Affect: Mood and affect normal.        Behavior: Behavior normal.     BP 103/61   Pulse 63   Ht 5\' 5"  (1.651 m)   Wt 122 lb 1.6 oz (55.4 kg)   BMI 20.32 kg/m  Wt Readings from Last 3 Encounters:  06/24/19 122 lb 1.6 oz (55.4 kg)  10/13/18 142 lb 12.8 oz (64.8 kg)  08/27/18 168 lb (76.2 kg)    Health Maintenance Due  Topic Date Due  . Hepatitis C Screening  Never done    There are no preventive care reminders to display for this patient.  CBC Latest Ref Rng & Units 08/28/2018 08/27/2018 07/30/2018  WBC 4.0 - 10.5 K/uL 16.5(H) 14.5(H) 9.9  Hemoglobin 12.0 - 15.0 g/dL 11.4(L) 12.1 11.6  Hematocrit 36 - 46 % 33.4(L) 35.2(L) 34.7  Platelets 150 - 400 K/uL 160 200 208   CMP Latest Ref Rng & Units 06/30/2017  Glucose 70 - 99 mg/dL 81  BUN 6 - 20 mg/dL 17  Creatinine 0.44 - 1.00 mg/dL 0.96  Sodium 135 - 145 mmol/L 135  Potassium 3.5 - 5.1 mmol/L 3.4(L)  Chloride 98 - 111 mmol/L 102  CO2 22 - 32 mmol/L 23  Calcium 8.9 - 10.3 mg/dL 9.4  Total Protein 6.5 - 8.1 g/dL 7.8  Total Bilirubin 0.3 - 1.2 mg/dL 1.0  Alkaline Phos 38 - 126 U/L 53  AST 15 - 41 U/L 24  ALT 0 - 44 U/L 25    Lab Results  Component Value Date   TSH 1.205 06/30/2017   Lab Results    Component Value Date   ALBUMIN 4.8 06/30/2017   ANIONGAP 10 06/30/2017  Lab Results  Component Value Date   CHOL 159 06/30/2017   HDL 67 06/30/2017   LDLCALC 87 06/30/2017   CHOLHDL 2.4 06/30/2017   Lab Results  Component Value Date   TRIG 24 06/30/2017   No results found for: HGBA1C    ASSESSMENT & PLAN:   Problem List Items Addressed This Visit      Other   Annual physical exam - Primary      No orders of the defined types were placed in this encounter.   1. Annual physical exam Patient physical exam is normal.  She goes to the gym at least once a wk  I advised her to go to the gym at least 3 times a week and continue to do    strength training exercises with aerobics. Follow-up: Return in about 1 year (around 06/23/2020).    Dr. Jane Canary [facility] [address] [place], [place] [address]   By signing my name below, I, [employer], attest that this documentation has been prepared under the direction and in the presence of Cletis Athens, MD. Electronically Signed: Cletis Athens, MD 06/24/19, 3:36 PM   I personally performed the services described in this documentation, which was SCRIBED in my presence. The recorded information has been reviewed and considered accurate. It has been edited as necessary during review. Cletis Athens, MD

## 2019-08-18 ENCOUNTER — Telehealth: Payer: Self-pay

## 2019-08-18 NOTE — Telephone Encounter (Signed)
Patient inquiring if there are any providers here that will sign the medical exemption form for Covid vaccine for breastfeeding mothers? [phone]

## 2019-08-18 NOTE — Telephone Encounter (Signed)
LMVM to notify our providers do not sign medical exemption forms. The guidelines/recommendation for the Covid vaccine shows that it is safe in pregnacy/breastfeeding.

## 2020-04-26 ENCOUNTER — Other Ambulatory Visit: Payer: Self-pay

## 2020-04-26 MED ORDER — SODIUM FLUORIDE 1.1 % DT CREA
TOPICAL_CREAM | DENTAL | 12 refills | Status: DC
  Filled 2020-04-26: qty 51, 30d supply, fill #0

## 2020-06-25 ENCOUNTER — Ambulatory Visit: Payer: 59 | Admitting: Obstetrics

## 2020-07-06 ENCOUNTER — Other Ambulatory Visit: Payer: Self-pay

## 2020-07-06 ENCOUNTER — Ambulatory Visit (INDEPENDENT_AMBULATORY_CARE_PROVIDER_SITE_OTHER): Payer: 59 | Admitting: Obstetrics

## 2020-07-06 ENCOUNTER — Encounter: Payer: Self-pay | Admitting: Obstetrics

## 2020-07-06 VITALS — BP 120/80 | Ht 64.0 in | Wt 129.0 lb

## 2020-07-06 DIAGNOSIS — Z01419 Encounter for gynecological examination (general) (routine) without abnormal findings: Secondary | ICD-10-CM

## 2020-07-06 NOTE — Progress Notes (Addendum)
Gynecology Annual Exam  PCP: Patient, No Pcp Per (Inactive)  Chief Complaint:  Chief Complaint  Patient presents with   Annual Exam    History of Present Illness:  [name] is a 30 y.o. G1P1001 who LMP was Patient's last menstrual period was 07/01/2020., presents today for her annual examination.  Her menses are regular every 28-30 days, lasting 4 day(s).  Dysmenorrhea none. She does not have intermenstrual bleeding.  She is single partner, contraception - none.  Last Pap: January 15, 2018  Results were: no abnormalities /neg HPV DNA neg Hx of STDs: none  There is no FH of breast cancer. There is no FH of ovarian cancer. The patient does do self-breast exams.  Tobacco use: The patient denies current or previous tobacco use. Alcohol use: social drinker Exercise: very active    The patient wears seatbelts: yes.   The patient reports that domestic violence in her life is absent.   Past Medical History:  Diagnosis Date   Postpartum care following vaginal delivery 08/27/2018   Supervision of normal first pregnancy 01/18/2018     Clinic [facility] Prenatal Labs Dating  LMP =8wk [place] Blood type: A/Negative/-- (01/10 0945)  Genetic Screen none Antibody:Negative (05/29 0930) Anatomic [place] Normal anatomy, anterior placenta Rubella: 7.43 (01/10 0945)  Varicella: Immune GTT 28 wk: 82     RPR: Non Reactive (05/29 0930)  Rhogam 06/04/18 HBsAg: Negative (01/10 0945)  TDaP vaccine  06/18/2018                      HIV: Non Reactive (05/29    Type A blood, Rh negative    UTI (urinary tract infection)     Past Surgical History:  Procedure Laterality Date   NO PAST SURGERIES      Prior to Admission medications   Medication Sig Start Date End Date Taking? Authorizing Provider  Multiple Vitamin (MULTIVITAMIN) tablet Take 1 tablet by mouth daily.   Yes [provider]  sodium fluoride (PREVIDENT 5000 PLUS) 1.1 % CREA dental cream Brush teeth thoroughly for at least 2 minutes, do not rinse  afterwards. 04/26/20  Yes     No Known Allergies  Gynecologic History: Patient's last menstrual period was 07/01/2020. History of abnormal pap smear: No History of STI: No   Obstetric History: G1P1001  Social History   Socioeconomic History   Marital status: Married    Spouse name: Not on file   Number of children: 1   Years of education: Not on file   Highest education level: Not on file  Occupational History    Employer: [employer]  Tobacco Use   Smoking status: Never   Smokeless tobacco: Never  Vaping Use   Vaping Use: Never used  Substance and Sexual Activity   Alcohol use: Not Currently    Comment: occasionally   Drug use: No   Sexual activity: Not Currently  Other Topics Concern   Not on file  Social History Narrative   ** Merged History Encounter **       Social Determinants of Health   Financial Resource Strain: Not on file  Food Insecurity: Not on file  Transportation Needs: Not on file  Physical Activity: Not on file  Stress: Not on file  Social Connections: Not on file  Intimate Partner Violence: Not on file    Family History  Problem Relation Age of Onset   Hypertension Father    Hypertension Maternal Grandfather    Thyroid disease  Maternal Grandfather    Diabetes Paternal Grandfather    Colon cancer Other 83    Review of Systems  Constitutional: Negative.   HENT: Negative.    Eyes: Negative.   Respiratory: Negative.    Cardiovascular: Negative.   Gastrointestinal: Negative.   Genitourinary: Negative.   Musculoskeletal: Negative.   Skin: Negative.   Neurological: Negative.   Endo/Heme/Allergies: Negative.   Psychiatric/Behavioral:  The patient is nervous/anxious (reports she tend tobe slightly anxious but does not need treatment.).     Physical Exam BP 120/80   Ht 5\' 4"  (1.626 m)   Wt 129 lb (58.5 kg)   LMP 07/01/2020   BMI 22.14 kg/m    Physical Exam Constitutional:      Appearance: Normal appearance. She is normal weight.   Genitourinary:     Vulva and rectum normal.  HENT:     Head: Normocephalic and atraumatic.     Nose: Nose normal.  Cardiovascular:     Rate and Rhythm: Normal rate and regular rhythm.     Pulses: Normal pulses.     Heart sounds: Normal heart sounds.  Pulmonary:     Effort: Pulmonary effort is normal.     Breath sounds: Normal breath sounds.  Abdominal:     General: Abdomen is flat.     Palpations: Abdomen is soft.  Musculoskeletal:        General: Normal range of motion.     Cervical back: Normal range of motion and neck supple.  Neurological:     General: No focal deficit present.     Mental Status: She is alert and oriented to person, place, and time.  Skin:    General: Skin is warm and dry.     Capillary Refill: Capillary refill takes less than 2 seconds.  Psychiatric:        Mood and Affect: Mood normal.        Behavior: Behavior normal.    Female chaperone present for pelvic and breast  portions of the physical exam  Results: AUDIT Questionnaire (screen for alcoholism): not done today    Assessment: 30 y.o. G41P1001 female here for routine annual gynecologic examination  Plan: Problem List Items Addressed This Visit   None Visit Diagnoses     Women's annual routine gynecological examination    -  Primary       Screening: -- Blood pressure screen normal -- Weight screening: normal -- Depression screening negative (PHQ-9) -- Nutrition: normal -- cholesterol screening: per PCP -- osteoporosis screening: not due -- tobacco screening: not using -- alcohol screening: AUDIT questionnaire indicates low-risk usage. -- family history of breast cancer screening: done. not at high risk. -- no evidence of domestic violence or intimate partner violence. -- STD screening: gonorrhea/chlamydia NAAT not collected per patient request. -- pap smear not collected per ASCCP guidelines -- flu vaccine  declines -- HPV vaccination series: has not received - pt  refuses Jeanette [name], CNM  07/06/2020 8:59 AM   07/06/2020 8:56 AM

## 2020-11-19 ENCOUNTER — Other Ambulatory Visit: Payer: Self-pay

## 2020-11-19 MED ORDER — SODIUM FLUORIDE 1.1 % DT GEL
DENTAL | 12 refills | Status: DC
  Filled 2020-11-19: qty 56, 30d supply, fill #0
  Filled 2021-07-19: qty 56, 30d supply, fill #1

## 2021-02-06 HISTORY — PX: WISDOM TOOTH EXTRACTION: SHX21

## 2021-03-07 ENCOUNTER — Other Ambulatory Visit: Payer: Self-pay

## 2021-03-07 MED ORDER — HYDROCODONE-ACETAMINOPHEN 5-325 MG PO TABS
ORAL_TABLET | ORAL | 0 refills | Status: DC
  Filled 2021-03-07: qty 12, 2d supply, fill #0

## 2021-03-07 MED ORDER — CHLORHEXIDINE GLUCONATE 0.12 % MT SOLN
OROMUCOSAL | 0 refills | Status: DC
  Filled 2021-03-07: qty 473, 15d supply, fill #0

## 2021-07-12 ENCOUNTER — Ambulatory Visit: Payer: 59 | Admitting: Obstetrics

## 2021-07-19 ENCOUNTER — Telehealth: Payer: 59 | Admitting: Physician Assistant

## 2021-07-19 ENCOUNTER — Other Ambulatory Visit: Payer: Self-pay

## 2021-07-19 DIAGNOSIS — H00015 Hordeolum externum left lower eyelid: Secondary | ICD-10-CM | POA: Diagnosis not present

## 2021-07-19 NOTE — Progress Notes (Signed)
I have spent 5 minutes in review of e-visit questionnaire, review and updating patient chart, medical decision making and response to patient.   Lyndsey Demos Cody Kaesen Rodriguez, PA-C    

## 2021-07-19 NOTE — Progress Notes (Signed)
  E-Visit for Stye   We are sorry that you are not feeling well. Here is how we plan to help!  Based on what you have shared with me it looks like you have a stye or a blocked tear duct.  A stye is an inflammation of the eyelid.  It is often a red, painful lump/swelling near the edge of the eyelid.  We have made appropriate suggestions for you based upon your presentation: Simple styes can be treated without medical intervention.  Most styes either resolve spontaneously or resolve with simple home treatment by applying warm compresses or heated washcloth to the stye for about 10-15 minutes three to four times a day. This causes the stye to drain and resolve. I would also consider some hydrating eye drops OTC like Visine over the next few days. If not resolving, I recommend an in-person evaluation.   HOME CARE:  Wash your hands often! Let the stye open on its own. Don't squeeze or open it. Don't rub your eyes. This can irritate your eyes and let in bacteria.  If you need to touch your eyes, wash your hands first. Don't wear eye makeup or contact lenses until the area has healed.  GET HELP RIGHT AWAY IF:  Your symptoms do not improve. You develop blurred or loss of vision. Your symptoms worsen (increased discharge, pain or redness).   Thank you for choosing an e-visit.  Your e-visit answers were reviewed by a board certified advanced clinical practitioner to complete your personal care plan. Depending upon the condition, your plan could have included both over the counter or prescription medications.  Please review your pharmacy choice. Make sure the pharmacy is open so you can pick up prescription now. If there is a problem, you may contact your provider through [employer] and have the prescription routed to another pharmacy.  Your safety is important to [place]. If you have drug allergies check your prescription carefully.   For the next 24 hours you can use MyChart to ask questions  about today's visit, request a non-urgent call back, or ask for a work or school excuse. You will get an email in the next two days asking about your experience. I hope that your e-visit has been valuable and will speed your recovery.

## 2021-08-28 ENCOUNTER — Encounter: Payer: Self-pay | Admitting: Obstetrics

## 2021-08-28 ENCOUNTER — Other Ambulatory Visit (HOSPITAL_COMMUNITY)
Admission: RE | Admit: 2021-08-28 | Discharge: 2021-08-28 | Disposition: A | Discharge status: Home / Self Care | Payer: 59 | Source: Ambulatory Visit | Attending: Obstetrics | Admitting: Obstetrics

## 2021-08-28 ENCOUNTER — Ambulatory Visit (INDEPENDENT_AMBULATORY_CARE_PROVIDER_SITE_OTHER): Payer: 59 | Admitting: Obstetrics

## 2021-08-28 VITALS — BP 122/70 | Ht 65.0 in | Wt 138.0 lb

## 2021-08-28 DIAGNOSIS — Z124 Encounter for screening for malignant neoplasm of cervix: Secondary | ICD-10-CM

## 2021-08-28 DIAGNOSIS — D229 Melanocytic nevi, unspecified: Secondary | ICD-10-CM | POA: Diagnosis not present

## 2021-08-28 DIAGNOSIS — Z01419 Encounter for gynecological examination (general) (routine) without abnormal findings: Secondary | ICD-10-CM | POA: Diagnosis not present

## 2021-08-28 NOTE — Progress Notes (Signed)
Gynecology Annual Exam  PCP: Patient, No Pcp Per  Chief Complaint: No chief complaint on file.   History of Present Illness:  [name] is a 31 y.o. G1P1001 who LMP was No LMP recorded., presents today for her annual examination.  Her menses are regular every 28-30 days, lasting 4 day(s).  Dysmenorrhea none. She does not have intermenstrual bleeding.  She is single partner, contraception - none.  Last Pap: January 15, 2018  Results were: no abnormalities  Hx of STDs: none  There is no FH of breast cancer. There is no FH of ovarian cancer. The patient does not do self-breast exams.  Tobacco use: The patient denies current or previous tobacco use. Alcohol use: social drinker Exercise: moderately active    The patient wears seatbelts: yes.   The patient reports that domestic violence in her life is absent.   Past Medical History:  Diagnosis Date   Postpartum care following vaginal delivery 08/27/2018   Supervision of normal first pregnancy 01/18/2018     Clinic [facility] Prenatal Labs Dating  LMP =8wk [place] Blood type: A/Negative/-- (01/10 0945)  Genetic Screen none Antibody:Negative (05/29 0930) Anatomic [place] Normal anatomy, anterior placenta Rubella: 7.43 (01/10 0945)  Varicella: Immune GTT 28 wk: 82     RPR: Non Reactive (05/29 0930)  Rhogam 06/04/18 HBsAg: Negative (01/10 0945)  TDaP vaccine  06/18/2018                      HIV: Non Reactive (05/29    Type A blood, Rh negative    UTI (urinary tract infection)     Past Surgical History:  Procedure Laterality Date   NO PAST SURGERIES      Prior to Admission medications   Medication Sig Start Date End Date Taking? Authorizing Provider  chlorhexidine (PERIDEX) 0.12 % solution RINSE MOUTH WITH 15ML (1 CAPFUL) FOR 30 SECONDS AM AND PM AFTER TOOTHBRUSHING. EXPECTORATE AFTER RINSING, DO NOT SWALLOW 03/07/21     HYDROcodone-acetaminophen (NORCO/VICODIN) 5-325 MG tablet TAKE 1 TO 2 TABLETS EVERY 4 TO 6 HOURS AS NEEDED FOR PAIN. 03/07/21      Multiple Vitamin (MULTIVITAMIN) tablet Take 1 tablet by mouth daily.    [provider]  sodium fluoride (PREVIDENT 5000 DRY MOUTH) 1.1 % GEL dental gel Brush twice a day and do not rinse. 11/19/20     sodium fluoride (PREVIDENT 5000 PLUS) 1.1 % CREA dental cream Brush teeth thoroughly for at least 2 minutes, do not rinse afterwards. 04/26/20       No Known Allergies  Gynecologic History: No LMP recorded. History of abnormal pap smear: No History of STI: No   Obstetric History: G1P1001  Social History   Socioeconomic History   Marital status: Married    Spouse name: Not on file   Number of children: 1   Years of education: Not on file   Highest education level: Not on file  Occupational History    Employer: [employer]  Tobacco Use   Smoking status: Never   Smokeless tobacco: Never  Vaping Use   Vaping Use: Never used  Substance and Sexual Activity   Alcohol use: Not Currently    Comment: occasionally   Drug use: No   Sexual activity: Not Currently  Other Topics Concern   Not on file  Social History Narrative   ** Merged History Encounter **       Social Determinants of Health   Financial Resource Strain: Not on file  Food Insecurity: Not on file  Transportation Needs: Not on file  Physical Activity: Not on file  Stress: Not on file  Social Connections: Not on file  Intimate Partner Violence: Not on file    Family History  Problem Relation Age of Onset   Hypertension Father    Hypertension Maternal Grandfather    Thyroid disease Maternal Grandfather    Diabetes Paternal Grandfather    Colon cancer Other 82    Review of Systems  Constitutional: Negative.   HENT: Negative.    Eyes: Negative.   Respiratory: Negative.    Cardiovascular: Negative.   Genitourinary: Negative.   All other systems reviewed and are negative.    Physical Exam There were no vitals taken for this visit.   Physical Exam Constitutional:      Appearance: Normal  appearance. She is normal weight.  Genitourinary:     Vulva and rectum normal.  Cardiovascular:     Rate and Rhythm: Normal rate and regular rhythm.  Pulmonary:     Effort: Pulmonary effort is normal.     Breath sounds: Normal breath sounds.  Abdominal:     General: Abdomen is flat.     Palpations: Abdomen is soft.  Musculoskeletal:        General: Normal range of motion.     Cervical back: Normal range of motion and neck supple.  Neurological:     General: No focal deficit present.     Mental Status: She is alert and oriented to person, place, and time.  Skin:    General: Skin is warm and dry.  Psychiatric:        Mood and Affect: Mood normal.        Behavior: Behavior normal.  Vitals reviewed.     Female chaperone present for pelvic and breast  portions of the physical exam  Results: AUDIT Questionnaire (screen for alcoholism): NA PHQ-9: 1   Assessment: 31 y.o. G12P1001 female here for routine annual gynecologic examination  Plan: Problem List Items Addressed This Visit   None   Screening: -- Blood pressure screen normal -- Weight screening: normal -- Depression screening negative (PHQ-9)score 1 -- Nutrition: normal -- cholesterol screening: not due for screening -- osteoporosis screening: not due -- tobacco screening: not using -- alcohol screening: AUDIT questionnaire indicates low-risk usage. -- family history of breast cancer screening: done. not at high risk. -- no evidence of domestic violence or intimate partner violence. -- STD screening: gonorrhea/chlamydia NAAT not collected per patient request. -- pap smear collected per ASCCP guidelines -- flu vaccine  declined -- HPV vaccination series: has not received - pt refuses  She does one mole located on her left leg above the knee but along the side of her upper leg that she would like removed by Dermatology. Referred to local  [facility]. Advised to start on a daily multivitamin with folic acid.  Follow up  in one year.  Imagene Riches, CNM  08/28/2021 2:10 PM   08/28/2021 2:10 PM

## 2021-09-10 ENCOUNTER — Encounter: Payer: Self-pay | Admitting: Obstetrics

## 2021-09-10 LAB — CYTOLOGY - PAP
Comment: NEGATIVE
Diagnosis: NEGATIVE
High risk HPV: NEGATIVE

## 2021-10-11 ENCOUNTER — Ambulatory Visit (INDEPENDENT_AMBULATORY_CARE_PROVIDER_SITE_OTHER): Payer: 59

## 2021-10-11 VITALS — BP 119/73 | Ht 65.0 in | Wt 138.0 lb

## 2021-10-11 DIAGNOSIS — N912 Amenorrhea, unspecified: Secondary | ICD-10-CM

## 2021-10-11 DIAGNOSIS — Z3201 Encounter for pregnancy test, result positive: Secondary | ICD-10-CM

## 2021-10-11 LAB — POCT URINE PREGNANCY: Preg Test, Ur: POSITIVE — AB

## 2021-10-11 NOTE — Progress Notes (Signed)
Subjective:    Jeanette [name] is a 31 y.o. female who presents for evaluation of amenorrhea. She believes she could be pregnant. Pregnancy is desired.  Last period was normal.   Lab Review Urine HCG: positive    Assessment:    Absence of menstruation.     Plan:    Pregnancy Test:  Positive: EDC: 06/04/2022. Briefly discussed positive results and sent to check out for scheduling for New OB appointments.

## 2021-10-17 ENCOUNTER — Other Ambulatory Visit: Payer: Self-pay

## 2021-10-30 ENCOUNTER — Telehealth: Payer: Self-pay

## 2021-10-30 NOTE — Telephone Encounter (Signed)
Pt calling; states she is running a low grade fever; what can she take to help with the fever and achines?  [phone]  Adv per new protocol - e.x. tylenol two q6h while awake, plain sudafed, warm salt water gargles, push fluids and rest.

## 2021-11-01 ENCOUNTER — Ambulatory Visit (INDEPENDENT_AMBULATORY_CARE_PROVIDER_SITE_OTHER): Payer: 59

## 2021-11-01 VITALS — Wt 138.0 lb

## 2021-11-01 DIAGNOSIS — Z369 Encounter for antenatal screening, unspecified: Secondary | ICD-10-CM

## 2021-11-01 DIAGNOSIS — Z3480 Encounter for supervision of other normal pregnancy, unspecified trimester: Secondary | ICD-10-CM

## 2021-11-01 DIAGNOSIS — Z3689 Encounter for other specified antenatal screening: Secondary | ICD-10-CM

## 2021-11-01 HISTORY — DX: Encounter for supervision of other normal pregnancy, unspecified trimester: Z34.80

## 2021-11-01 NOTE — Progress Notes (Signed)
New OB Intake  I connected with  Jeanette [name] on 11/01/21 at  9:15 AM EDT by telephone and verified that I am speaking with the correct person using two identifiers. Nurse is located at [employer] and pt is located at home.  I explained I am completing New OB Intake today. We discussed her EDD of 06/04/2022 that is based on LMP of 08/28/2021. Pt is G2/P1001. I reviewed her allergies, medications, Medical/Surgical/OB history, and appropriate screenings. Based on history, this is a/an pregnancy uncomplicated .   Patient Active Problem List   Diagnosis Date Noted   Annual physical exam 06/24/2019   Recurrent UTI 04/01/2017    Concerns addressed today None  Delivery Plans:  Plans to deliver at [facility].  Anatomy US Explained first scheduled [place] will be around scheduled soon and an anatomy scan will be done at 20 weeks.  Labs Discussed genetic screening with patient. Patient undecided about genetic testing  Discussed possible labs to be drawn at new OB appointment.  COVID Vaccine Patient has not had COVID vaccine.   Social Determinants of Health Food Insecurity: denies food insecurity Transportation: Patient denies transportation needs. Childcare: Discussed no children allowed at ultrasound appointments.   First visit review I reviewed new OB appt with pt. I explained she will have ob bloodwork and pap smear/pelvic exam if indicated. Explained pt will be seen by Dr. Ludwig Lean at first visit; encounter routed to appropriate provider.   Cleophas Dunker, [facility] 11/01/2021  9:34 AM

## 2021-11-05 ENCOUNTER — Other Ambulatory Visit: Payer: Self-pay

## 2021-11-05 DIAGNOSIS — Z3480 Encounter for supervision of other normal pregnancy, unspecified trimester: Secondary | ICD-10-CM

## 2021-11-05 DIAGNOSIS — Z369 Encounter for antenatal screening, unspecified: Secondary | ICD-10-CM

## 2021-11-05 NOTE — Progress Notes (Signed)
Patient has been scheduled for [place] before NOB physical.  Thanks

## 2021-11-08 ENCOUNTER — Other Ambulatory Visit (HOSPITAL_COMMUNITY)
Admission: RE | Admit: 2021-11-08 | Discharge: 2021-11-08 | Disposition: A | Discharge status: Home / Self Care | Payer: 59 | Source: Ambulatory Visit | Attending: Obstetrics and Gynecology | Admitting: Obstetrics and Gynecology

## 2021-11-08 ENCOUNTER — Other Ambulatory Visit: Payer: 59

## 2021-11-08 ENCOUNTER — Other Ambulatory Visit: Payer: Self-pay

## 2021-11-08 DIAGNOSIS — Z3480 Encounter for supervision of other normal pregnancy, unspecified trimester: Secondary | ICD-10-CM | POA: Diagnosis not present

## 2021-11-08 DIAGNOSIS — Z3A10 10 weeks gestation of pregnancy: Secondary | ICD-10-CM | POA: Insufficient documentation

## 2021-11-08 DIAGNOSIS — Z3481 Encounter for supervision of other normal pregnancy, first trimester: Secondary | ICD-10-CM | POA: Diagnosis not present

## 2021-11-08 DIAGNOSIS — Z369 Encounter for antenatal screening, unspecified: Secondary | ICD-10-CM

## 2021-11-08 DIAGNOSIS — Z3491 Encounter for supervision of normal pregnancy, unspecified, first trimester: Secondary | ICD-10-CM | POA: Diagnosis not present

## 2021-11-09 LAB — URINALYSIS, ROUTINE W REFLEX MICROSCOPIC
Bilirubin, UA: NEGATIVE
Glucose, UA: NEGATIVE
Ketones, UA: NEGATIVE
Leukocytes,UA: NEGATIVE
Nitrite, UA: NEGATIVE
Protein,UA: NEGATIVE
RBC, UA: NEGATIVE
Specific Gravity, UA: 1.005 (ref 1.005–1.030)
Urobilinogen, Ur: 0.2 mg/dL (ref 0.2–1.0)
pH, UA: 6.5 (ref 5.0–7.5)

## 2021-11-09 LAB — CBC/D/PLT+RPR+RH+ABO+RUBIGG...
Antibody Screen: NEGATIVE
Basophils Absolute: 0 10*3/uL (ref 0.0–0.2)
Basos: 0 %
EOS (ABSOLUTE): 0.1 10*3/uL (ref 0.0–0.4)
Eos: 1 %
HCV Ab: NONREACTIVE
HIV Screen 4th Generation wRfx: NONREACTIVE
Hematocrit: 37 % (ref 34.0–46.6)
Hemoglobin: 12.3 g/dL (ref 11.1–15.9)
Hepatitis B Surface Ag: NEGATIVE
Immature Grans (Abs): 0 10*3/uL (ref 0.0–0.1)
Immature Granulocytes: 0 %
Lymphocytes Absolute: 1.3 10*3/uL (ref 0.7–3.1)
Lymphs: 11 %
MCH: 29.6 pg (ref 26.6–33.0)
MCHC: 33.2 g/dL (ref 31.5–35.7)
MCV: 89 fL (ref 79–97)
Monocytes Absolute: 0.6 10*3/uL (ref 0.1–0.9)
Monocytes: 5 %
Neutrophils Absolute: 10.1 10*3/uL — ABNORMAL HIGH (ref 1.4–7.0)
Neutrophils: 83 %
Platelets: 255 10*3/uL (ref 150–450)
RBC: 4.15 x10E6/uL (ref 3.77–5.28)
RDW: 12.7 % (ref 11.7–15.4)
RPR Ser Ql: NONREACTIVE
Rh Factor: NEGATIVE
Rubella Antibodies, IGG: 4.51 index (ref >0.99)
Varicella zoster IgG: 2263 index (ref >165)
WBC: 12.2 10*3/uL — ABNORMAL HIGH (ref 3.4–10.8)

## 2021-11-09 LAB — HCV INTERPRETATION

## 2021-11-10 LAB — CULTURE, OB URINE

## 2021-11-10 LAB — URINE CULTURE, OB REFLEX

## 2021-11-11 LAB — MONITOR DRUG PROFILE 14(MW)
Amphetamine Scrn, Ur: NEGATIVE ng/mL
BARBITURATE SCREEN URINE: NEGATIVE ng/mL
BENZODIAZEPINE SCREEN, URINE: NEGATIVE ng/mL
Buprenorphine, Urine: NEGATIVE ng/mL
CANNABINOIDS UR QL SCN: NEGATIVE ng/mL
Cocaine (Metab) Scrn, Ur: NEGATIVE ng/mL
Creatinine(Crt), U: 16 mg/dL — ABNORMAL LOW (ref 20.0–300.0)
Fentanyl, Urine: NEGATIVE pg/mL
Meperidine Screen, Urine: NEGATIVE ng/mL
Methadone Screen, Urine: NEGATIVE ng/mL
OXYCODONE+OXYMORPHONE UR QL SCN: NEGATIVE ng/mL
Opiate Scrn, Ur: NEGATIVE ng/mL
Ph of Urine: 5.9 (ref 4.5–8.9)
Phencyclidine Qn, Ur: NEGATIVE ng/mL
Propoxyphene Scrn, Ur: NEGATIVE ng/mL
SPECIFIC GRAVITY: 1.0029
Tramadol Screen, Urine: NEGATIVE ng/mL

## 2021-11-11 LAB — NICOTINE SCREEN, URINE: Cotinine Ql Scrn, Ur: NEGATIVE ng/mL

## 2021-11-11 LAB — URINE CYTOLOGY ANCILLARY ONLY
Chlamydia: NEGATIVE
Comment: NEGATIVE
Comment: NORMAL
Neisseria Gonorrhea: NEGATIVE

## 2021-11-13 ENCOUNTER — Ambulatory Visit (INDEPENDENT_AMBULATORY_CARE_PROVIDER_SITE_OTHER): Payer: 59

## 2021-11-13 DIAGNOSIS — Z369 Encounter for antenatal screening, unspecified: Secondary | ICD-10-CM

## 2021-11-13 DIAGNOSIS — Z3480 Encounter for supervision of other normal pregnancy, unspecified trimester: Secondary | ICD-10-CM

## 2021-11-13 DIAGNOSIS — Z3A11 11 weeks gestation of pregnancy: Secondary | ICD-10-CM

## 2021-11-13 DIAGNOSIS — Z3481 Encounter for supervision of other normal pregnancy, first trimester: Secondary | ICD-10-CM | POA: Diagnosis not present

## 2021-11-15 ENCOUNTER — Encounter: Payer: Self-pay | Admitting: Obstetrics and Gynecology

## 2021-11-15 ENCOUNTER — Encounter: Payer: 59 | Admitting: Obstetrics and Gynecology

## 2021-11-15 ENCOUNTER — Ambulatory Visit (INDEPENDENT_AMBULATORY_CARE_PROVIDER_SITE_OTHER): Payer: 59 | Admitting: Obstetrics and Gynecology

## 2021-11-15 VITALS — BP 122/57 | HR 76 | Ht 65.0 in | Wt 143.6 lb

## 2021-11-15 DIAGNOSIS — O219 Vomiting of pregnancy, unspecified: Secondary | ICD-10-CM

## 2021-11-15 DIAGNOSIS — Z6791 Unspecified blood type, Rh negative: Secondary | ICD-10-CM | POA: Insufficient documentation

## 2021-11-15 DIAGNOSIS — O360110 Maternal care for anti-D [Rh] antibodies, first trimester, not applicable or unspecified: Secondary | ICD-10-CM

## 2021-11-15 DIAGNOSIS — O26899 Other specified pregnancy related conditions, unspecified trimester: Secondary | ICD-10-CM | POA: Insufficient documentation

## 2021-11-15 DIAGNOSIS — O26891 Other specified pregnancy related conditions, first trimester: Secondary | ICD-10-CM | POA: Diagnosis not present

## 2021-11-15 DIAGNOSIS — R0981 Nasal congestion: Secondary | ICD-10-CM

## 2021-11-15 DIAGNOSIS — Z3A11 11 weeks gestation of pregnancy: Secondary | ICD-10-CM

## 2021-11-15 DIAGNOSIS — Z3480 Encounter for supervision of other normal pregnancy, unspecified trimester: Secondary | ICD-10-CM

## 2021-11-15 NOTE — Patient Instructions (Signed)

## 2021-11-15 NOTE — Progress Notes (Signed)
OBSTETRIC INITIAL PRENATAL VISIT  Subjective:    Jeanette [name] is being seen today for her first obstetrical visit.  This is a planned pregnancy. She is a 31 y.o. G2P1001 female at 45w2dgestation, Estimated Date of Delivery: 06/04/22 with Patient's last menstrual period was 08/28/2021 (exact date).,  consistent with 11 week sono. Her obstetrical history is significant for  none . Relationship with FOB: spouse, living together. Patient does intend to breast feed. Pregnancy history fully reviewed.    Still dealing with some congestion from a recent cold.  Advised on nasal saline spray.   OB History  Gravida Para Term Preterm AB Living  '2 1 1 '$ 0 0 1  SAB IAB Ectopic Multiple Live Births  0 0 0 0 1    # Outcome Date GA Lbr Len/2nd Weight Sex Delivery Anes PTL Lv  2 Current           1 Term 08/27/18 465w4d5:14 / 00:28 6 lb 5.6 oz (2.88 kg) F Vag-Spont Local  LIV     Name: MIBLIMIE, VANESS   Apgar1: 9  Apgar5: 9    Gynecologic History:  Last pap smear was 08/28/2021.  Results were Normal.  Denies h/o abnormal pap smears in the past.  Denies history of STIs.  Contraception prior to conception: None   Past Medical History:  Diagnosis Date   Type A blood, Rh negative    UTI (urinary tract infection)     Family History  Problem Relation Age of Onset   Hypertension Father    Epilepsy Sister    ADD / ADHD Brother    Hyperlipidemia Maternal Grandmother    Hypertension Maternal Grandfather    Thyroid disease Maternal Grandfather    Heart murmur Maternal Grandfather    Diabetes Paternal Grandfather    Congestive Heart Failure Paternal Grandfather    Colon cancer Other [id number]  Past Surgical History:  Procedure Laterality Date   WISDOM TOOTH EXTRACTION  02/2021   three;    Social History   Socioeconomic History   Marital status: Married    Spouse name: JaEdison Nasuti Number of children: 1   Years of education: 14   Highest education level: Not on file  Occupational History     Employer: [employer]  Tobacco Use   Smoking status: Never   Smokeless tobacco: Never  Vaping Use   Vaping Use: Never used  Substance and Sexual Activity   Alcohol use: Not Currently    Comment: occasionally   Drug use: No   Sexual activity: Yes    Partners: Male    Birth control/protection: None  Other Topics Concern   Not on file  Social History Narrative   ** Merged History Encounter **       Social Determinants of Health   Financial Resource Strain: Low Risk  (11/01/2021)   Overall Financial Resource Strain (CARDIA)    Difficulty of Paying Living Expenses: Not hard at all  Food Insecurity: No Food Insecurity (11/01/2021)   Hunger Vital Sign    Worried About Running Out of Food in the Last Year: Never true    RaBoydn the Last Year: Never true  Transportation Needs: No Transportation Needs (11/01/2021)   PRAPARE - TrHydrologistMedical): No    Lack of Transportation (Non-Medical): No  Physical Activity: Insufficiently Active (11/01/2021)   Exercise Vital Sign    Days of Exercise per Week: 2  days    Minutes of Exercise per Session: 40 min  Stress: No Stress Concern Present (11/01/2021)   [place]    Feeling of Stress : Not at all  Social Connections: Moderately Integrated (11/01/2021)   Social Connection and Isolation Panel [NHANES]    Frequency of Communication with Friends and Family: Three times a week    Frequency of Social Gatherings with Friends and Family: Three times a week    Attends Religious Services: More than 4 times per year    Active Member of Clubs or Organizations: No    Attends Archivist Meetings: Never    Marital Status: Married  Human resources officer Violence: Not At Risk (11/01/2021)   Humiliation, Afraid, Rape, and Kick questionnaire    Fear of Current or Ex-Partner: No    Emotionally Abused: No    Physically Abused: No    Sexually  Abused: No    Current Outpatient Medications on File Prior to Visit  Medication Sig Dispense Refill   Prenatal Vit-Fe Fumarate-FA (PRENATAL 19) 29-1 MG CHEW Chew 2 tablets by mouth daily.     sodium fluoride (PREVIDENT 5000 DRY MOUTH) 1.1 % GEL dental gel Brush twice a day and do not rinse. 56 g 12   No current facility-administered medications on file prior to visit.    No Known Allergies   Review of Systems General: Not Present- Fever, Weight Loss and Weight Gain. Skin: Not Present- Rash. HEENT: Not Present- Blurred Vision, Headache and Bleeding Gums. Present - congestion Respiratory: Not Present- Difficulty Breathing. Breast: Not Present- Breast Mass. Cardiovascular: Not Present- Chest Pain, Elevated Blood Pressure, Fainting / Blacking Out and Shortness of Breath. Gastrointestinal: Not Present- Abdominal Pain, Constipation. Present - Nausea and Vomiting (mild, not taking anything). Female Genitourinary: Not Present- Frequency, Painful Urination, Pelvic Pain, Vaginal Bleeding, Vaginal Discharge, Contractions, regular, Fetal Movements Decreased, Urinary Complaints and Vaginal Fluid. Musculoskeletal: Not Present- Back Pain and Leg Cramps. Neurological: Not Present- Dizziness. Psychiatric: Not Present- Depression.     Objective:   Blood pressure (!) 122/57, pulse 76, weight 143 lb 9.6 oz (65.1 kg), last menstrual period 08/28/2021, unknown if currently breastfeeding.  Body mass index is 23.9 kg/m.  General Appearance:    Alert, cooperative, no distress, appears stated age  Head:    Normocephalic, without obvious abnormality, atraumatic  Eyes:    PERRL, conjunctiva/corneas clear, EOM's intact, both eyes  Ears:    Normal external ear canals, both ears  Nose:   Nares normal, septum midline, mucosa normal, no drainage or sinus tenderness  Throat:   Lips, mucosa, and tongue normal; teeth and gums normal  Neck:   Supple, symmetrical, trachea midline, no adenopathy; thyroid: no  enlargement/tenderness/nodules; no carotid bruit or JVD  Back:     Symmetric, no curvature, ROM normal, no CVA tenderness  Lungs:     Clear to auscultation bilaterally, respirations unlabored  Chest Wall:    No tenderness or deformity   Heart:    Regular rate and rhythm, S1 and S2 normal, no murmur, rub or gallop  Abdomen:     Soft, non-tender, bowel sounds active all four quadrants, no masses, no organomegaly.  FHT 148 bpm.  Genitalia:    Pelvic: deferred. Patient had annual exam ~ 2 months ago.   Extremities:   Extremities normal, atraumatic, no cyanosis or edema  Pulses:   2+ and symmetric all extremities  Skin:   Skin color, texture, turgor normal, no rashes  or lesions  Lymph nodes:   Cervical, supraclavicular, and axillary nodes normal  Neurologic:   CNII-XII intact, normal strength, sensation and reflexes throughout     Assessment:   1. Supervision of other normal pregnancy, antepartum   2. Nausea/vomiting in pregnancy   3. Mild nasal congestion     Plan:   1. Supervision of other normal pregnancy, antepartum - Initial labs reviewed. - Prenatal vitamins encouraged. - Problem list reviewed and updated. - New OB counseling:  The patient has been given an overview regarding routine prenatal care.  Recommendations regarding diet, weight gain, and exercise in pregnancy were given. - Prenatal testing, optional genetic testing, and ultrasound use in pregnancy were reviewed.  Traditional genetic screening vs cell-fee DNA genetic screening discussed, including risks and benefits. Testing declined. - Benefits of Breast Feeding were discussed. The patient is encouraged to consider nursing her baby post partum.  2. Nausea/vomiting in pregnancy - Reports it has improved, only noting mild symptoms at this time, not taking anything.   3. Mild nasal congestion - Discussed use of nasal saline spray, Mucinex, antihistamines. Included safe medication list in AVS.   4.  Rh negative state in  antepartum period - Patient will need Rhogam at [redacted] weeks gestation.   Follow up in 4 weeks.    Rubie Maid, MD [facility]

## 2021-11-20 ENCOUNTER — Ambulatory Visit
Admission: EM | Admit: 2021-11-20 | Discharge: 2021-11-20 | Disposition: A | Discharge status: Home / Self Care | Payer: 59 | Attending: Urgent Care | Admitting: Urgent Care

## 2021-11-20 DIAGNOSIS — H109 Unspecified conjunctivitis: Secondary | ICD-10-CM

## 2021-11-20 MED ORDER — POLYMYXIN B-TRIMETHOPRIM 10000-0.1 UNIT/ML-% OP SOLN: 1.0000 [drp] | Freq: Four times a day (QID) | OPHTHALMIC | 0 refills | Status: AC

## 2021-11-20 NOTE — Discharge Instructions (Signed)
Follow up here or with your primary care provider if your symptoms are worsening or not improving with treatment.     

## 2021-11-20 NOTE — ED Triage Notes (Signed)
Patient reports itchy, burning, red and draining in right eye today.

## 2021-11-20 NOTE — ED Provider Notes (Signed)
[facility]    CSN: [id number] Arrival date & time: 11/20/21  1747      History   Chief Complaint Chief Complaint  Patient presents with   Conjunctivitis    HPI Jeanette [name] is a 31 y.o. female.    Conjunctivitis    Presents as to urgent care with report of itchy, burning, red, drainage in right eye starting today.  Endorses frequent discharge from the eye that is thick and mucousy.  Past Medical History:  Diagnosis Date   Type A blood, Rh negative    UTI (urinary tract infection)     Patient Active Problem List   Diagnosis Date Noted   Rh negative state in antepartum period 11/15/2021   Supervision of other normal pregnancy, antepartum 11/01/2021   Annual physical exam 06/24/2019   Recurrent UTI 04/01/2017    Past Surgical History:  Procedure Laterality Date   WISDOM TOOTH EXTRACTION  02/2021   three;    OB History     Gravida  2   Para  1   Term  1   Preterm      AB      Living  1      SAB      IAB      Ectopic      Multiple  0   Live Births  1            Home Medications    Prior to Admission medications   Medication Sig Start Date End Date Taking? Authorizing Provider  Prenatal Vit-Fe Fumarate-FA (PRENATAL 19) 29-1 MG CHEW Chew 2 tablets by mouth daily.    [provider]  sodium fluoride (PREVIDENT 5000 DRY MOUTH) 1.1 % GEL dental gel Brush twice a day and do not rinse. 11/19/20       Family History Family History  Problem Relation Age of Onset   Hypertension Father    Epilepsy Sister    ADD / ADHD Brother    Hyperlipidemia Maternal Grandmother    Hypertension Maternal Grandfather    Thyroid disease Maternal Grandfather    Heart murmur Maternal Grandfather    Diabetes Paternal Grandfather    Congestive Heart Failure Paternal Grandfather    Colon cancer Other 4    Social History Social History   Tobacco Use   Smoking status: Never   Smokeless tobacco: Never  Vaping Use   Vaping Use:  Never used  Substance Use Topics   Alcohol use: Not Currently    Comment: occasionally   Drug use: No     Allergies   Patient has no known allergies.   Review of Systems Review of Systems   Physical Exam Triage Vital Signs ED Triage Vitals  Enc Vitals Group     BP 11/20/21 1843 104/66     Pulse Rate 11/20/21 1843 77     Resp 11/20/21 1843 18     Temp 11/20/21 1843 98.1 F (36.7 C)     Temp Source 11/20/21 1843 Oral     SpO2 11/20/21 1843 98 %     Weight 11/20/21 1846 143 lb (64.9 kg)     Height 11/20/21 1846 '5\' 5"'$  (1.651 m)     Head Circumference --      Peak Flow --      Pain Score 11/20/21 1846 1     Pain Loc --      Pain Edu? --      Excl. in [place]? --  No data found.  Updated Vital Signs BP 104/66 (BP Location: Left Arm)   Pulse 77   Temp 98.1 F (36.7 C) (Oral)   Resp 18   Ht '5\' 5"'$  (1.651 m)   Wt 143 lb (64.9 kg)   LMP 08/28/2021 (Exact Date)   SpO2 98%   BMI 23.80 kg/m   Visual Acuity Right Eye Distance:   Left Eye Distance:   Bilateral Distance:    Right Eye Near:   Left Eye Near:    Bilateral Near:     Physical Exam Eyes:     General:        Right eye: Discharge present.     Conjunctiva/sclera:     Right eye: Hemorrhage present.      UC Treatments / Results  Labs (all labs ordered are listed, but only abnormal results are displayed) Labs Reviewed - No data to display  EKG   Radiology No results found.  Procedures Procedures (including critical care time)  Medications Ordered in UC Medications - No data to display  Initial Impression / Assessment and Plan / UC Course  I have reviewed the triage vital signs and the nursing notes.  Pertinent labs & imaging results that were available during my care of the patient were reviewed by me and considered in my medical decision making (see chart for details).   Treating for bacterial conjunctivitis of the right eye with Polytrim.   Final Clinical Impressions(s) / UC Diagnoses    Final diagnoses:  None   Discharge Instructions   None    ED Prescriptions   None    PDMP not reviewed this encounter.   Rose Phi, [place] 11/20/21 [phone]

## 2021-11-21 ENCOUNTER — Encounter: Payer: 59 | Admitting: Obstetrics

## 2021-12-18 ENCOUNTER — Ambulatory Visit (INDEPENDENT_AMBULATORY_CARE_PROVIDER_SITE_OTHER): Payer: 59 | Admitting: Certified Nurse Midwife

## 2021-12-18 VITALS — BP 100/60 | HR 80 | Wt 145.0 lb

## 2021-12-18 DIAGNOSIS — Z3480 Encounter for supervision of other normal pregnancy, unspecified trimester: Secondary | ICD-10-CM

## 2021-12-18 DIAGNOSIS — Z3A16 16 weeks gestation of pregnancy: Secondary | ICD-10-CM

## 2021-12-18 LAB — POCT URINALYSIS DIPSTICK OB
Bilirubin, UA: NEGATIVE
Blood, UA: NEGATIVE
Glucose, UA: NEGATIVE
Ketones, UA: NEGATIVE
Leukocytes, UA: NEGATIVE
Nitrite, UA: NEGATIVE
POC,PROTEIN,UA: NEGATIVE
Spec Grav, UA: 1.01 (ref 1.010–1.025)
Urobilinogen, UA: 1 E.U./dL
pH, UA: 5.5 (ref 5.0–8.0)

## 2021-12-18 NOTE — Addendum Note (Signed)
Addended by: Quintella Baton D on: 12/18/2021 03:03 PM   Modules accepted: Orders

## 2021-12-18 NOTE — Progress Notes (Signed)
ROB doing well, not sure about movement yet. Discussed u/s for anatomy next visit. She denies any concerns at this time. Reviewed round ligament pain .  Follow up 4 wks.   Philip Aspen, CNM

## 2021-12-18 NOTE — Patient Instructions (Signed)
Round Ligament Pain  The round ligaments are a pair of cord-like tissues that help support the uterus. They can become a source of pain during pregnancy as the ligaments soften and stretch as the baby grows. The pain usually begins in the second trimester (13-28 weeks) of pregnancy, and should only last for a few seconds when it occurs. However, the pain can come and go until the baby is delivered. The pain does not cause harm to the baby. Round ligament pain is usually a short, sharp, and pinching pain, but it can also be a dull, lingering, and aching pain. The pain is felt in the lower side of the abdomen or in the groin. It usually starts deep in the groin and moves up to the outside of the hip area. The pain may happen when you: Suddenly change position, such as quickly going from a sitting to standing position. Do physical activity. Cough or sneeze. Follow these instructions at home: Managing pain  When the pain starts, relax. Then, try any of these methods to help with the pain: Sit down. Flex your knees up to your abdomen. Lie on your side with one pillow under your abdomen and another pillow between your legs. Sit in a warm bath for 15-20 minutes or until the pain goes away. General instructions Watch your condition for any changes. Move slowly when you sit down or stand up. Stop or reduce your physical activities if they cause pain. Avoid long walks if they cause pain. Take over-the-counter and prescription medicines only as told by your health care provider. Keep all follow-up visits. This is important. Contact a health care provider if: Your pain does not go away with treatment. You feel pain in your back that you did not have before. Your medicine is not helping. You have a fever or chills. You have nausea or vomiting. You have diarrhea. You have pain when you urinate. Get help right away if: You have pain that is a rhythmic, cramping pain similar to labor pains. Labor  pains are usually 2 minutes apart, last for about 1 minute, and involve a bearing down feeling or pressure in your pelvis. You have vaginal bleeding. These symptoms may represent a serious problem that is an emergency. Do not wait to see if the symptoms will go away. Get medical help right away. Call your local emergency services (911 in the [place] Do not drive yourself to the hospital. Summary Round ligament pain is felt in the lower abdomen or groin. This pain usually begins in the second trimester (13-28 weeks) and should only last for a few seconds when it occurs. You may notice the pain when you suddenly change position, when you cough or sneeze, or during physical activity. Relaxing, flexing your knees to your abdomen, lying on one side, or taking a warm bath may help to get rid of the pain. Contact your health care provider if the pain does not go away. This information is not intended to replace advice given to you by your health care provider. Make sure you discuss any questions you have with your health care provider. Document Revised: 03/07/2020 Document Reviewed: 03/07/2020 Elsevier Patient Education  2023 Elsevier Inc.  

## 2022-01-06 NOTE — L&D Delivery Note (Signed)
Delivery Note   Jeanette [name] is a 32 y.o. G2P2002 at [redacted]w[redacted]d Estimated Date of Delivery: 06/04/22  PRE-OPERATIVE DIAGNOSIS:  [redacted]w[redacted]d pregnancy.  SROM  POST-OPERATIVE DIAGNOSIS:  [redacted]w[redacted]d pregnancy s/p Vaginal, Spontaneous  2nd degree laceration, repaired  Delivery Type: Vaginal, Spontaneous    Delivery Anesthesia: None   Labor Complications:none    ESTIMATED BLOOD LOSS: 375  ml    FINDINGS:   1) female infant, Apgar scores of 7   at 1 minute and 9   at 5 minutes and a birthweight of 7 lbs., 4.7 ounces.     SPECIMENS:   PLACENTA:   Appearance: Intact , marginal cord insertion   Removal: Spontaneous      Disposition: per protocol  CORD BLOOD: Collected  DISPOSITION:  Infant left in stable condition in the delivery room, with L&D personnel and mother  NARRATIVE SUMMARY: Labor course:  Jeanette [name] is a [id number] at [redacted]w[redacted]d who presented to Labor & Delivery for SROM and contractions. Her initial cervical exam was 4/90/0. Labor proceeded spontaneously and she was found to be completely dilated at 2239. With excellent maternal pushing effort, she birthed a viable female infant at 68. There wast a nuchal cord that was easily reduced. The shoulders were birthed without difficulty. The infant was placed skin-to-skin with [name]. The cord was doubly clamped and cut by the father when pulsations ceased. The placenta delivered spontaneously and was noted to be intact with a 3VC and marginal insertion. A perineal and vaginal examination was performed. Lacerations: 2nd degree  Lacerations were repaired with Vicryl suture using local anesthesia. [name] tolerated this well. Mother and baby were left in stable condition.   Guadlupe Spanish, CNM 06/09/2022 4:15 AM

## 2022-01-08 ENCOUNTER — Other Ambulatory Visit: Payer: Self-pay

## 2022-01-08 MED ORDER — SODIUM FLUORIDE 1.1 % DT GEL
DENTAL | 12 refills | Status: DC
  Filled 2022-01-08 – 2022-02-21 (×2): qty 56, 30d supply, fill #0

## 2022-01-15 ENCOUNTER — Ambulatory Visit (INDEPENDENT_AMBULATORY_CARE_PROVIDER_SITE_OTHER): Payer: Commercial Managed Care - PPO | Admitting: Licensed Practical Nurse

## 2022-01-15 ENCOUNTER — Ambulatory Visit: Payer: Commercial Managed Care - PPO

## 2022-01-15 ENCOUNTER — Encounter: Payer: Self-pay | Admitting: Licensed Practical Nurse

## 2022-01-15 VITALS — BP 121/49 | HR 76 | Wt 153.5 lb

## 2022-01-15 DIAGNOSIS — Z363 Encounter for antenatal screening for malformations: Secondary | ICD-10-CM

## 2022-01-15 DIAGNOSIS — Z3A19 19 weeks gestation of pregnancy: Secondary | ICD-10-CM | POA: Diagnosis not present

## 2022-01-15 DIAGNOSIS — Z3480 Encounter for supervision of other normal pregnancy, unspecified trimester: Secondary | ICD-10-CM

## 2022-01-15 DIAGNOSIS — Z3A16 16 weeks gestation of pregnancy: Secondary | ICD-10-CM

## 2022-01-15 DIAGNOSIS — Z3482 Encounter for supervision of other normal pregnancy, second trimester: Secondary | ICD-10-CM

## 2022-01-15 DIAGNOSIS — Z3A20 20 weeks gestation of pregnancy: Secondary | ICD-10-CM

## 2022-01-15 LAB — POCT URINALYSIS DIPSTICK
Bilirubin, UA: NEGATIVE
Blood, UA: NEGATIVE
Glucose, UA: NEGATIVE
Ketones, UA: NEGATIVE
Leukocytes, UA: NEGATIVE
Nitrite, UA: NEGATIVE
Protein, UA: NEGATIVE
Spec Grav, UA: 1.01 (ref 1.010–1.025)
Urobilinogen, UA: 0.2 E.U./dL
pH, UA: 5.5 (ref 5.0–8.0)

## 2022-01-15 NOTE — Progress Notes (Signed)
Routine Prenatal Care Visit  Subjective  Jeanette [name] is a 32 y.o. G2P1001 at 31w0dbeing seen today for ongoing prenatal care.  She is currently monitored for the following issues for this low-risk pregnancy and has Recurrent UTI; Annual physical exam; Supervision of other normal pregnancy, antepartum; and Rh negative state in antepartum period on their problem list.  ----------------------------------------------------------------------------------- Patient reports  has had sinus congestion on off since November. She also has had a dry cough that now seems kinds of wet. She did have a fever that lasted 3 days when these symptoms started. She recently had RSV.  .Marland Kitchen Pt breathing without difficulty, lungs CTAB, pt coughed once during visit.  Reviewed she is most likely still recovering from RSV and is on her way to improving. Comfort measures reviewed.  Please be seen if the cough worsens, you have any difficulty breathing or wheezing.  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 Vag. Bleeding: None.  Movement: Present. Leaking Fluid denies.  ----------------------------------------------------------------------------------- The following portions of the patient's history were reviewed and updated as appropriate: allergies, current medications, past family history, past medical history, past social history, past surgical history and problem list. Problem list updated.  Objective  Blood pressure (!) 121/49, pulse 76, weight 153 lb 8 oz (69.6 kg), last menstrual period 08/28/2021, unknown if currently breastfeeding. Pregravid weight 135 lb (61.2 kg) Total Weight Gain 18 lb 8 oz (8.392 kg) Urinalysis: Urine Protein    Urine Glucose    Fetal Status: Fetal Heart Rate (bpm): 140   Movement: Present     General:  Alert, oriented and cooperative. Patient is in no acute distress.  Skin:  Skin is warm and dry. No rash noted.   Cardiovascular: Normal heart rate noted  Respiratory: Normal respiratory effort, no problems with respiration noted  Abdomen: Soft, gravid, appropriate for gestational age. Pain/Pressure: Absent     Pelvic:  Cervical exam deferred        Extremities: Normal range of motion.  Edema: None  Mental Status: Normal mood and affect. Normal behavior. Normal judgment and thought content.   Assessment   32y.o. G2P1001 at 266w0dy  06/04/2022, by Last Menstrual Period presenting for routine prenatal visit  Plan   second Problems (from 11/01/21 to present)     No problems associated with this episode.         general obstetric precautions including but not limited to vaginal bleeding, contractions, leaking of fluid and fetal movement were reviewed in detail with the patient. Please refer to After Visit Summary for other counseling recommendations.   Return in about 4 weeks (around 02/12/2022) for ROB.  LyRoberto ScalesCNStephenedical Group  01/17/22  11:56 AM

## 2022-01-17 ENCOUNTER — Other Ambulatory Visit: Payer: Self-pay

## 2022-02-04 ENCOUNTER — Telehealth: Payer: Commercial Managed Care - PPO | Admitting: Nurse Practitioner

## 2022-02-04 NOTE — Progress Notes (Signed)
Natalyn, If your symptoms improve with Tylenol it is a good sign that this is hopefully not bacterial. If that continues to be the case I would continue tylenol and the saline rinses, steam  humidifier etc. If you feel that it is not improving and you are no longer getting relief from those medications I would contact your OB to discuss antibiotic use in pregnancy.   There is always a risk with medication use during pregnancy. Tylenol and Saline pose little risk and if that is working I would continue that management unless you have a fever or worsening symptoms.   Vicks Vapor rub is also safe to use to help with opening your sinuses.   Let me know if you have any additional questions.  Thank you Apolonio Schneiders FNP-C   Many mothers need to take medicines during their pregnancy and while nursing.  Almost all medicines pass into the breast milk in small quantities.  Most are generally considered safe for a mother to take but some medicines must be avoided.  After reviewing your E-Visit request, I recommend that you consult your OB/GYN or pediatrician for medical advice in relation to your condition and prescription medications while pregnant or breastfeeding.  NOTE:  There will be NO CHARGE for this eVisit  If you are having a true medical emergency please call 911.    For an urgent face to face visit, [facility] has six urgent care centers for your convenience:     [facility] [place] Get Driving Directions [phone] [address] [place], [place] [address]    [facility] [facility]) Get Driving Directions [phone] [place], [place] [address]  [facility] [place] [facility]) Get Driving Directions [phone] [address] [place],  [place]  [address]  [facility] Get Driving Directions [phone] [address] [place],  [place], [place] [address]   [facility] Get Driving Directions  [phone] [address] [place], [place] [address]    [facility] [place] Get Driving Directions [phone] [address] [place],  [address]  Your MyChart E-visit questionnaire answers were reviewed by a board certified advanced clinical practitioner to complete your personal care plan based on your specific symptoms.  Thank you for using e-Visits.

## 2022-02-14 ENCOUNTER — Ambulatory Visit (INDEPENDENT_AMBULATORY_CARE_PROVIDER_SITE_OTHER): Payer: Commercial Managed Care - PPO | Admitting: Certified Nurse Midwife

## 2022-02-14 ENCOUNTER — Encounter: Payer: Self-pay | Admitting: Certified Nurse Midwife

## 2022-02-14 VITALS — BP 124/68 | HR 72 | Wt 160.4 lb

## 2022-02-14 DIAGNOSIS — Z3A24 24 weeks gestation of pregnancy: Secondary | ICD-10-CM

## 2022-02-14 DIAGNOSIS — Z3482 Encounter for supervision of other normal pregnancy, second trimester: Secondary | ICD-10-CM

## 2022-02-14 LAB — POCT URINALYSIS DIPSTICK OB
Bilirubin, UA: NEGATIVE
Blood, UA: NEGATIVE
Glucose, UA: NEGATIVE
Ketones, UA: NEGATIVE
Leukocytes, UA: NEGATIVE
Nitrite, UA: NEGATIVE
POC,PROTEIN,UA: NEGATIVE
Spec Grav, UA: 1.01 (ref 1.010–1.025)
Urobilinogen, UA: 0.2 E.U./dL
pH, UA: 6.5 (ref 5.0–8.0)

## 2022-02-14 NOTE — Progress Notes (Signed)
ROB doing well, feeling good movement. Denies any concerns today. Discussed 28 wk visit. Information sheet given on eating prior to glucose testing. Follow up 4 wk or prn .   Philip Aspen, CNM

## 2022-02-14 NOTE — Patient Instructions (Signed)
Oral Glucose Tolerance Test During Pregnancy Why am I having this test? The oral glucose tolerance test (OGTT) is done to check how your body processes blood sugar (glucose). This is one of several tests used to diagnose diabetes that develops during pregnancy (gestational diabetes mellitus). Gestational diabetes is a short-term form of diabetes that some women develop while they are pregnant. It usually occurs during the second trimester of pregnancy and goes away after delivery. Testing, or screening, for gestational diabetes usually occurs at weeks 24-28 of pregnancy. You may have the OGTT test after having a 1-hour glucose screening test if the results from that test indicate that you may have gestational diabetes. This test may also be needed if: You have a history of gestational diabetes. There is a history of giving birth to very large babies or of losing pregnancies (having stillbirths). You have signs and symptoms of diabetes, such as: Changes in your eyesight. Tingling or numbness in your hands or feet. Changes in hunger, thirst, and urination, and these are not explained by your pregnancy. What is being tested? This test measures the amount of glucose in your blood at different times during a period of 3 hours. This shows how well your body can process glucose. What kind of sample is taken?  Blood samples are required for this test. They are usually collected by inserting a needle into a blood vessel. How do I prepare for this test? For 3 days before your test, eat normally. Have plenty of carbohydrate-rich foods. Follow instructions from your health care provider about: Eating or drinking restrictions on the day of the test. You may be asked not to eat or drink anything other than water (to fast) starting 8-10 hours before the test. Changing or stopping your regular medicines. Some medicines may interfere with this test. Tell a health care provider about: All medicines you are  taking, including vitamins, herbs, eye drops, creams, and over-the-counter medicines. Any blood disorders you have. Any surgeries you have had. Any medical conditions you have. What happens during the test? First, your blood glucose will be measured. This is referred to as your fasting blood glucose because you fasted before the test. Then, you will drink a glucose solution that contains a certain amount of glucose. Your blood glucose will be measured again 1, 2, and 3 hours after you drink the solution. This test takes about 3 hours to complete. You will need to stay at the testing location during this time. During the testing period: Do not eat or drink anything other than the glucose solution. Do not exercise. Do not use any products that contain nicotine or tobacco, such as cigarettes, e-cigarettes, and chewing tobacco. These can affect your test results. If you need help quitting, ask your health care provider. The testing procedure may vary among health care providers and hospitals. How are the results reported? Your results will be reported as milligrams of glucose per deciliter of blood (mg/dL) or millimoles per liter (mmol/L). There is more than one source for screening and diagnosis reference values used to diagnose gestational diabetes. Your health care provider will compare your results to normal values that were established after testing a large group of people (reference values). Reference values may vary among labs and hospitals. For this test ([facility]-Coustan), reference values are: Fasting: 95 mg/dL (5.3 mmol/L). 1 hour: 180 mg/dL (10.0 mmol/L). 2 hour: 155 mg/dL (8.6 mmol/L). 3 hour: 140 mg/dL (7.8 mmol/L). What do the results mean? Results below the reference values are  considered normal. If two or more of your blood glucose levels are at or above the reference values, you may be diagnosed with gestational diabetes. If only one level is high, your health care provider may  suggest repeat testing or other tests to confirm a diagnosis. Talk with your health care provider about what your results mean. Questions to ask your health care provider Ask your health care provider, or the department that is doing the test: When will my results be ready? How will I get my results? What are my treatment options? What other tests do I need? What are my next steps? Summary The oral glucose tolerance test (OGTT) is one of several tests used to diagnose diabetes that develops during pregnancy (gestational diabetes mellitus). Gestational diabetes is a short-term form of diabetes that some women develop while they are pregnant. You may have the OGTT test after having a 1-hour glucose screening test if the results from that test show that you may have gestational diabetes. You may also have this test if you have any symptoms or risk factors for this type of diabetes. Talk with your health care provider about what your results mean. This information is not intended to replace advice given to you by your health care provider. Make sure you discuss any questions you have with your health care provider. Document Revised: 07/30/2021 Document Reviewed: 06/02/2019 Elsevier Patient Education  [place].

## 2022-02-21 ENCOUNTER — Other Ambulatory Visit: Payer: Self-pay

## 2022-03-14 ENCOUNTER — Other Ambulatory Visit: Payer: Self-pay

## 2022-03-14 ENCOUNTER — Other Ambulatory Visit: Payer: Commercial Managed Care - PPO

## 2022-03-14 ENCOUNTER — Encounter: Payer: Self-pay | Admitting: Obstetrics and Gynecology

## 2022-03-14 ENCOUNTER — Ambulatory Visit (INDEPENDENT_AMBULATORY_CARE_PROVIDER_SITE_OTHER): Payer: Commercial Managed Care - PPO | Admitting: Obstetrics and Gynecology

## 2022-03-14 VITALS — BP 115/50 | HR 65 | Wt 166.4 lb

## 2022-03-14 DIAGNOSIS — Z3A28 28 weeks gestation of pregnancy: Secondary | ICD-10-CM | POA: Diagnosis not present

## 2022-03-14 DIAGNOSIS — Z3480 Encounter for supervision of other normal pregnancy, unspecified trimester: Secondary | ICD-10-CM

## 2022-03-14 DIAGNOSIS — Z23 Encounter for immunization: Secondary | ICD-10-CM

## 2022-03-14 DIAGNOSIS — O360130 Maternal care for anti-D [Rh] antibodies, third trimester, not applicable or unspecified: Secondary | ICD-10-CM | POA: Diagnosis not present

## 2022-03-14 DIAGNOSIS — O26893 Other specified pregnancy related conditions, third trimester: Secondary | ICD-10-CM

## 2022-03-14 DIAGNOSIS — Z3493 Encounter for supervision of normal pregnancy, unspecified, third trimester: Secondary | ICD-10-CM | POA: Diagnosis not present

## 2022-03-14 LAB — POCT URINALYSIS DIPSTICK OB
Bilirubin, UA: NEGATIVE
Blood, UA: NEGATIVE
Ketones, UA: NEGATIVE
Leukocytes, UA: NEGATIVE
Nitrite, UA: NEGATIVE
POC,PROTEIN,UA: NEGATIVE
Spec Grav, UA: 1.01 (ref 1.010–1.025)
Urobilinogen, UA: 0.2 E.U./dL
pH, UA: 6.5 (ref 5.0–8.0)

## 2022-03-14 MED ORDER — RHO D IMMUNE GLOBULIN 1500 UNIT/2ML IJ SOSY
300.0000 ug | PREFILLED_SYRINGE | Freq: Once | INTRAMUSCULAR | Status: AC
  Administered 2022-03-14: 300 ug via INTRAMUSCULAR

## 2022-03-14 NOTE — Progress Notes (Signed)
ROB [redacted]w[redacted]d She has some round ligament pain today. She feels tired. She has good fetal movement, no other new concerns today. TDAP/BTC/Rhogam done today.

## 2022-03-14 NOTE — Progress Notes (Signed)
ROB: Patient is a 32 y.o. G2P1001 at 60w2dwho presents for routine OB care.  Doing well, no issues.  For 28 week labs today.  Plans to  plans to breastfeed, desires Vasectomy for contraception. For Tdap today, signed blood consent.  Rhogam administered. Discussed circumcision. RTC in 2 weeks.

## 2022-03-15 LAB — 28 WEEKS RH-PANEL
Antibody Screen: NEGATIVE
Basophils Absolute: 0 10*3/uL (ref 0.0–0.2)
Basos: 0 %
EOS (ABSOLUTE): 0.1 10*3/uL (ref 0.0–0.4)
Eos: 1 %
Gestational Diabetes Screen: 77 mg/dL (ref 70–139)
HIV Screen 4th Generation wRfx: NONREACTIVE
Hematocrit: 34 % (ref 34.0–46.6)
Hemoglobin: 10.8 g/dL — ABNORMAL LOW (ref 11.1–15.9)
Immature Grans (Abs): 0 10*3/uL (ref 0.0–0.1)
Immature Granulocytes: 0 %
Lymphocytes Absolute: 1 10*3/uL (ref 0.7–3.1)
Lymphs: 10 %
MCH: 28.6 pg (ref 26.6–33.0)
MCHC: 31.8 g/dL (ref 31.5–35.7)
MCV: 90 fL (ref 79–97)
Monocytes Absolute: 0.6 10*3/uL (ref 0.1–0.9)
Monocytes: 6 %
Neutrophils Absolute: 8.8 10*3/uL — ABNORMAL HIGH (ref 1.4–7.0)
Neutrophils: 83 %
Platelets: 235 10*3/uL (ref 150–450)
RBC: 3.78 x10E6/uL (ref 3.77–5.28)
RDW: 12.7 % (ref 11.7–15.4)
RPR Ser Ql: NONREACTIVE
WBC: 10.5 10*3/uL (ref 3.4–10.8)

## 2022-03-18 ENCOUNTER — Encounter: Payer: Self-pay | Admitting: Obstetrics and Gynecology

## 2022-03-28 ENCOUNTER — Encounter: Payer: Commercial Managed Care - PPO | Admitting: Obstetrics

## 2022-03-28 ENCOUNTER — Ambulatory Visit (INDEPENDENT_AMBULATORY_CARE_PROVIDER_SITE_OTHER): Payer: Commercial Managed Care - PPO | Admitting: Obstetrics

## 2022-03-28 VITALS — BP 115/55 | HR 70 | Wt 169.0 lb

## 2022-03-28 DIAGNOSIS — Z3A30 30 weeks gestation of pregnancy: Secondary | ICD-10-CM

## 2022-03-28 DIAGNOSIS — Z3483 Encounter for supervision of other normal pregnancy, third trimester: Secondary | ICD-10-CM

## 2022-03-28 DIAGNOSIS — Z3480 Encounter for supervision of other normal pregnancy, unspecified trimester: Secondary | ICD-10-CM

## 2022-03-28 LAB — POCT URINALYSIS DIPSTICK OB
Bilirubin, UA: NEGATIVE
Blood, UA: NEGATIVE
Ketones, UA: NEGATIVE
Leukocytes, UA: NEGATIVE
Nitrite, UA: NEGATIVE
Spec Grav, UA: 1.015 (ref 1.010–1.025)
Urobilinogen, UA: 0.2 E.U./dL
pH, UA: 6 (ref 5.0–8.0)

## 2022-03-28 NOTE — Progress Notes (Signed)
Routine Prenatal Care Visit  Subjective  Jeanette [name] is a 32 y.o. G2P1001 at [redacted]w[redacted]d being seen today for ongoing prenatal care.  She is currently monitored for the following issues for this low-risk pregnancy and has Recurrent UTI; Annual physical exam; Supervision of other normal pregnancy, antepartum; and Rh negative state in antepartum period on their problem list.  ----------------------------------------------------------------------------------- Patient reports no complaints.   Contractions: Not present. Vag. Bleeding: None.  Movement: Present. Leaking Fluid denies.  ----------------------------------------------------------------------------------- The following portions of the patient's history were reviewed and updated as appropriate: allergies, current medications, past family history, past medical history, past social history, past surgical history and problem list. Problem list updated.  Objective  Blood pressure (!) 115/55, pulse 70, weight 169 lb (76.7 kg), last menstrual period 08/28/2021, unknown if currently breastfeeding. Pregravid weight 135 lb (61.2 kg) Total Weight Gain 34 lb (15.4 kg) Urinalysis: Urine Protein Trace  Urine Glucose (!) Small (1+)  Fetal Status: Fetal Heart Rate (bpm): 126 Fundal Height: 30 cm Movement: Present     General:  Alert, oriented and cooperative. Patient is in no acute distress.  Skin: Skin is warm and dry. No rash noted.   Cardiovascular: Normal heart rate noted  Respiratory: Normal respiratory effort, no problems with respiration noted  Abdomen: Soft, gravid, appropriate for gestational age. Pain/Pressure: Absent     Pelvic:  Cervical exam deferred        Extremities: Normal range of motion.  Edema: None  Mental Status: Normal mood and affect. Normal behavior. Normal judgment and thought content.   Assessment   32 y.o. G2P1001 at [redacted]w[redacted]d by  06/04/2022, by Last Menstrual Period presenting for routine prenatal visit  Plan   second Problems  (from 11/01/21 to present)     No problems associated with this episode.      Reviewed her glucose and 28 wks labs. Her Hgb is 10. We discussed higher iron foods and adding citrus to those meals for better absorption.  Preterm labor symptoms and general obstetric precautions including but not limited to vaginal bleeding, contractions, leaking of fluid and fetal movement were reviewed in detail with the patient. Please refer to After Visit Summary for other counseling recommendations.   Return in about 2 weeks (around 04/11/2022) for return OB.  Imagene Riches, CNM  03/28/2022 9:45 AM

## 2022-04-10 ENCOUNTER — Ambulatory Visit: Payer: Commercial Managed Care - PPO | Admitting: Dermatology

## 2022-04-10 VITALS — BP 106/66

## 2022-04-10 DIAGNOSIS — D485 Neoplasm of uncertain behavior of skin: Secondary | ICD-10-CM

## 2022-04-10 NOTE — Progress Notes (Signed)
   New Patient Visit   Subjective  Jeanette [name] is a 32 y.o. female who presents for the following: Spot on right post lat thigh x 3 years reddish, irritated with shavin  The following portions of the chart were reviewed this encounter and updated as appropriate: medications, allergies, medical history  Review of Systems:  No other skin or systemic complaints except as noted in HPI or Assessment and Plan.  Objective  Well appearing patient in no apparent distress; mood and affect are within normal limits. A focused examination was performed of the following areas: Relevant exam findings are noted in the Assessment and Plan.  Right lat thigh 0.6 cm red papule   Assessment & Plan     Neoplasm of uncertain behavior of skin Right lat thigh  Epidermal / dermal shaving  Lesion diameter (cm):  0.7 Informed consent: discussed and consent obtained   Timeout: patient name, date of birth, surgical site, and procedure verified   Procedure prep:  Patient was prepped and draped in usual sterile fashion Prep type:  Isopropyl alcohol Anesthesia: the lesion was anesthetized in a standard fashion   Anesthetic:  1% lidocaine w/ epinephrine 1-100,000 buffered w/ 8.4% NaHCO3 Instrument used: flexible razor blade   Hemostasis achieved with: pressure, aluminum chloride and electrodesiccation   Outcome: patient tolerated procedure well   Post-procedure details: sterile dressing applied and wound care instructions given   Dressing type: bandage and petrolatum    Specimen 1 - Surgical pathology Differential Diagnosis: Hemangioma vs other  Check Margins: No    No follow-ups on file.  I, Joanie Coddington, CMA, am acting as scribe for Armida Sans, MD .  Documentation: I have reviewed the above documentation for accuracy and completeness, and I agree with the above.  Armida Sans, MD

## 2022-04-11 ENCOUNTER — Encounter: Payer: Self-pay | Admitting: Obstetrics and Gynecology

## 2022-04-11 ENCOUNTER — Ambulatory Visit (INDEPENDENT_AMBULATORY_CARE_PROVIDER_SITE_OTHER): Payer: Commercial Managed Care - PPO | Admitting: Obstetrics and Gynecology

## 2022-04-11 VITALS — BP 121/68 | HR 76 | Wt 170.5 lb

## 2022-04-11 DIAGNOSIS — Z3483 Encounter for supervision of other normal pregnancy, third trimester: Secondary | ICD-10-CM

## 2022-04-11 DIAGNOSIS — Z3480 Encounter for supervision of other normal pregnancy, unspecified trimester: Secondary | ICD-10-CM

## 2022-04-11 DIAGNOSIS — Z3A32 32 weeks gestation of pregnancy: Secondary | ICD-10-CM

## 2022-04-11 LAB — POCT URINALYSIS DIPSTICK OB
Bilirubin, UA: NEGATIVE
Blood, UA: NEGATIVE
Glucose, UA: NEGATIVE
Ketones, UA: NEGATIVE
Leukocytes, UA: NEGATIVE
Nitrite, UA: NEGATIVE
Spec Grav, UA: 1.01 (ref 1.010–1.025)
Urobilinogen, UA: 0.2 E.U./dL
pH, UA: 6.5 (ref 5.0–8.0)

## 2022-04-11 NOTE — Progress Notes (Signed)
ROB. Patient states daily fetal movement.She states no questions or concerns at this time.   

## 2022-04-11 NOTE — Progress Notes (Signed)
ROB: Feels well.  Has no complaints.  Reports daily fetal movement.  Normal 1 hour GCT.

## 2022-04-16 ENCOUNTER — Telehealth: Payer: Self-pay

## 2022-04-16 NOTE — Telephone Encounter (Signed)
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

## 2022-04-16 NOTE — Telephone Encounter (Signed)
Advised patient of results/hd  

## 2022-04-20 ENCOUNTER — Encounter: Payer: Self-pay | Admitting: Dermatology

## 2022-04-25 ENCOUNTER — Ambulatory Visit (INDEPENDENT_AMBULATORY_CARE_PROVIDER_SITE_OTHER): Payer: Commercial Managed Care - PPO | Admitting: Obstetrics and Gynecology

## 2022-04-25 ENCOUNTER — Encounter: Payer: Self-pay | Admitting: Obstetrics and Gynecology

## 2022-04-25 VITALS — BP 102/66 | HR 66 | Wt 178.8 lb

## 2022-04-25 DIAGNOSIS — Z3A34 34 weeks gestation of pregnancy: Secondary | ICD-10-CM

## 2022-04-25 DIAGNOSIS — Z3483 Encounter for supervision of other normal pregnancy, third trimester: Secondary | ICD-10-CM

## 2022-04-25 LAB — POCT URINALYSIS DIPSTICK OB
Bilirubin, UA: NEGATIVE
Blood, UA: NEGATIVE
Glucose, UA: NEGATIVE
Ketones, UA: NEGATIVE
Leukocytes, UA: NEGATIVE
Nitrite, UA: NEGATIVE
POC,PROTEIN,UA: NEGATIVE
Spec Grav, UA: 1.01 (ref 1.010–1.025)
Urobilinogen, UA: 0.2 E.U./dL
pH, UA: 6.5 (ref 5.0–8.0)

## 2022-04-25 NOTE — Progress Notes (Signed)
ROB: Doing well.  Just a little bit "tired".  Denies contractions.  Taking vitamins.  Cultures next visit.

## 2022-04-25 NOTE — Progress Notes (Signed)
ROB. She states good daily fetal movement, denies pain or pressure unless she has been on her feet for a lengthy period of time.

## 2022-05-09 ENCOUNTER — Other Ambulatory Visit (HOSPITAL_COMMUNITY)
Admission: RE | Admit: 2022-05-09 | Discharge: 2022-05-09 | Disposition: A | Discharge status: Home / Self Care | Payer: Commercial Managed Care - PPO | Source: Ambulatory Visit | Attending: Licensed Practical Nurse | Admitting: Licensed Practical Nurse

## 2022-05-09 ENCOUNTER — Ambulatory Visit (INDEPENDENT_AMBULATORY_CARE_PROVIDER_SITE_OTHER): Payer: Commercial Managed Care - PPO | Admitting: Licensed Practical Nurse

## 2022-05-09 VITALS — BP 112/62 | HR 59 | Wt 179.6 lb

## 2022-05-09 DIAGNOSIS — Z3483 Encounter for supervision of other normal pregnancy, third trimester: Secondary | ICD-10-CM | POA: Diagnosis not present

## 2022-05-09 DIAGNOSIS — Z3A36 36 weeks gestation of pregnancy: Secondary | ICD-10-CM

## 2022-05-09 DIAGNOSIS — Z3685 Encounter for antenatal screening for Streptococcus B: Secondary | ICD-10-CM | POA: Diagnosis not present

## 2022-05-09 DIAGNOSIS — Z113 Encounter for screening for infections with a predominantly sexual mode of transmission: Secondary | ICD-10-CM

## 2022-05-09 LAB — POCT URINALYSIS DIPSTICK
Bilirubin, UA: NEGATIVE
Blood, UA: NEGATIVE
Glucose, UA: NEGATIVE
Ketones, UA: NEGATIVE
Leukocytes, UA: NEGATIVE
Nitrite, UA: NEGATIVE
Protein, UA: NEGATIVE
Spec Grav, UA: 1.02 (ref 1.010–1.025)
Urobilinogen, UA: 1 E.U./dL
pH, UA: 6 (ref 5.0–8.0)

## 2022-05-09 NOTE — Addendum Note (Signed)
Addended by: Burtis Junes on: 05/09/2022 11:24 AM   Modules accepted: Orders

## 2022-05-09 NOTE — Patient Instructions (Signed)
Third Trimester of Pregnancy  The third trimester of pregnancy is from week 28 through week 40. This is months 7 through 9. The third trimester is a time when the unborn baby (fetus) is growing rapidly. At the end of the ninth month, the fetus is about 20 inches long and weighs 6-10 pounds. Body changes during your third trimester During the third trimester, your body will continue to go through many changes. The changes vary and generally return to normal after your baby is born. Physical changes Your weight will continue to increase. You can expect to gain 25-35 pounds (11-16 kg) by the end of the pregnancy if you begin pregnancy at a normal weight. If you are underweight, you can expect to gain 28-40 lb (about 13-18 kg), and if you are overweight, you can expect to gain 15-25 lb (about 7-11 kg). You may begin to get stretch marks on your hips, abdomen, and breasts. Your breasts will continue to grow and may hurt. A yellow fluid (colostrum) may leak from your breasts. This is the first milk you are producing for your baby. You may have changes in your hair. These can include thickening of your hair, rapid growth, and changes in texture. Some people also have hair loss during or after pregnancy, or hair that feels dry or thin. Your belly button may stick out. You may notice more swelling in your hands, face, or ankles. Health changes You may have heartburn. You may have constipation. You may develop hemorrhoids. You may develop swollen, bulging veins (varicose veins) in your legs. You may have increased body aches in the pelvis, back, or thighs. This is due to weight gain and increased hormones that are relaxing your joints. You may have increased tingling or numbness in your hands, arms, and legs. The skin on your abdomen may also feel numb. You may feel short of breath because of your expanding uterus. Other changes You may urinate more often because the fetus is moving lower into your pelvis  and pressing on your bladder. You may have more problems sleeping. This may be caused by the size of your abdomen, an increased need to urinate, and an increase in your body's metabolism. You may notice the fetus "dropping," or moving lower in your abdomen (lightening). You may have increased vaginal discharge. You may notice that you have pain around your pelvic bone as your uterus distends. Follow these instructions at home: Medicines Follow your health care provider's instructions regarding medicine use. Specific medicines may be either safe or unsafe to take during pregnancy. Do not take any medicines unless approved by your health care provider. Take a prenatal vitamin that contains at least 600 micrograms (mcg) of folic acid. Eating and drinking Eat a healthy diet that includes fresh fruits and vegetables, whole grains, good sources of protein such as meat, eggs, or tofu, and low-fat dairy products. Avoid raw meat and unpasteurized juice, milk, and cheese. These carry germs that can harm you and your baby. Eat 4 or 5 small meals rather than 3 large meals a day. You may need to take these actions to prevent or treat constipation: Drink enough fluid to keep your urine pale yellow. Eat foods that are high in fiber, such as beans, whole grains, and fresh fruits and vegetables. Limit foods that are high in fat and processed sugars, such as fried or sweet foods. Activity Exercise only as directed by your health care provider. Most people can continue their usual exercise routine during pregnancy. Try   to exercise for 30 minutes at least 5 days a week. Stop exercising if you experience contractions in the uterus. Stop exercising if you develop pain or cramping in the lower abdomen or lower back. Avoid heavy lifting. Do not exercise if it is very hot or humid or if you are at a high altitude. If you choose to, you may continue to have sex unless your health care provider tells you not  to. Relieving pain and discomfort Take frequent breaks and rest with your legs raised (elevated) if you have leg cramps or low back pain. Take warm sitz baths to soothe any pain or discomfort caused by hemorrhoids. Use hemorrhoid cream if your health care provider approves. Wear a supportive bra to prevent discomfort from breast tenderness. If you develop varicose veins: Wear support hose as told by your health care provider. Elevate your feet for 15 minutes, 3-4 times a day. Limit salt in your diet. Safety Talk to your health care provider before traveling far distances. Do not use hot tubs, steam rooms, or saunas. Wear your seat belt at all times when driving or riding in a car. Talk with your health care provider if someone is verbally or physically abusive to you. Preparing for birth To prepare for the arrival of your baby: Take prenatal classes to understand, practice, and ask questions about labor and delivery. Visit the hospital and tour the maternity area. Purchase a rear-facing car seat and make sure you know how to install it in your car. Prepare the baby's room or sleeping area. Make sure to remove all pillows and stuffed animals from the baby's crib to prevent suffocation. General instructions Avoid cat litter boxes and soil used by cats. These carry germs that can cause birth defects in the baby. If you have a cat, ask someone to clean the litter box for you. Do not douche or use tampons. Do not use scented sanitary pads. Do not use any products that contain nicotine or tobacco, such as cigarettes, e-cigarettes, and chewing tobacco. If you need help quitting, ask your health care provider. Do not use any herbal remedies, illegal drugs, or medicines that were not prescribed to you. Chemicals in these products can harm your baby. Do not drink alcohol. You will have more frequent prenatal exams during the third trimester. During a routine prenatal visit, your health care provider  will do a physical exam, perform tests, and discuss your overall health. Keep all follow-up visits. This is important. Where to find more information American Pregnancy Association: americanpregnancy.org American College of Obstetricians and Gynecologists: acog.org/en/Womens%20Health/Pregnancy Office on Women's Health: womenshealth.gov/pregnancy Contact a health care provider if you have: A fever. Mild pelvic cramps, pelvic pressure, or nagging pain in your abdominal area or lower back. Vomiting or diarrhea. Bad-smelling vaginal discharge or foul-smelling urine. Pain when you urinate. A headache that does not go away when you take medicine. Visual changes or see spots in front of your eyes. Get help right away if: Your water breaks. You have regular contractions less than 5 minutes apart. You have spotting or bleeding from your vagina. You have severe abdominal pain. You have difficulty breathing. You have chest pain. You have fainting spells. You have not felt your baby move for the time period told by your health care provider. You have new or increased pain, swelling, or redness in an arm or leg. Summary The third trimester of pregnancy is from week 28 through week 40 (months 7 through 9). You may have more problems   sleeping. This can be caused by the size of your abdomen, an increased need to urinate, and an increase in your body's metabolism. You will have more frequent prenatal exams during the third trimester. Keep all follow-up visits. This is important. This information is not intended to replace advice given to you by your health care provider. Make sure you discuss any questions you have with your health care provider. Document Revised: 06/01/2019 Document Reviewed: 04/07/2019 Elsevier Patient Education  2023 [employer]  Group B Streptococcus Infection During Pregnancy Group B Streptococcus (GBS) is a type of bacteria that is often found in healthy people. It is  commonly found in the rectum, vagina, and intestines. In people who are healthy and not pregnant, the bacteria rarely cause serious illness or complications. However, women who test positive for GBS during pregnancy can pass the bacteria to the baby during childbirth. This can cause serious infection in the baby after birth. Women with GBS may also have infections during their pregnancy or soon after childbirth. The infections include urinary tract infections (UTIs) or infections of the uterus. GBS also increases a woman's risk of complications during pregnancy, such as early labor or delivery, miscarriage, or stillbirth. Routine testing for GBS is recommended for all pregnant women. What are the causes? This condition is caused by bacteria called Streptococcus agalactiae. What increases the risk? You may have a higher risk for GBS infection during pregnancy if you had one during a past pregnancy. What are the signs or symptoms? In most cases, GBS infection does not cause symptoms in pregnant women. If symptoms exist, they may include: Labor that starts before the 37th week of pregnancy. A UTI or bladder infection. This may cause a fever, frequent urination, or pain and burning during urination. Fever during labor. There can also be a rapid heartbeat in the mother or baby. Rare but serious symptoms of a GBS infection in women include: Blood infection (septicemia). This may cause fever, chills, or confusion. Lung infection (pneumonia). This may cause fever, chills, cough, rapid breathing, chest pain, or difficulty breathing. Bone, joint, skin, or soft tissue infection. How is this diagnosed? You may be screened for GBS between week 35 and week 37 of pregnancy. If you have symptoms of preterm labor, you may be screened earlier. This condition is diagnosed based on lab test results from: A swab of fluid from the vagina and rectum. A urine sample. How is this treated? This condition is treated with  antibiotic medicine. Antibiotic medicine may be given: To you when you go into labor, or as soon as your water breaks. The medicines will continue until after you give birth. If you are having a cesarean delivery, you do not need antibiotics unless your water has broken. To your baby, if he or she requires treatment. Your health care provider will check your baby to decide if he or she needs antibiotics to prevent a serious infection. Follow these instructions at home: Take over-the-counter and prescription medicines only as told by your health care provider. Take your antibiotic medicine as told by your health care provider. Do not stop taking the antibiotic even if you start to feel better. Keep all pre-birth (prenatal) visits and follow-up visits as told by your health care provider. This is important. Contact a health care provider if: You have pain or burning when you urinate. You have to urinate more often than usual. You have a fever or chills. You develop a bad-smelling vaginal discharge. Get help right away if:   Your water breaks. You go into labor. You have severe pain in your abdomen. You have difficulty breathing. You have chest pain. These symptoms may represent a serious problem that is an emergency. Do not wait to see if the symptoms will go away. Get medical help right away. Call your local emergency services (911 in the [place] Do not drive yourself to the hospital. Summary GBS is a type of bacteria that is common in healthy people. During pregnancy, colonization with GBS can cause serious complications for you or your baby. Your health care provider will screen you between 35 and 37 weeks of pregnancy to determine if you are colonized with GBS. If you are colonized with GBS during pregnancy, your health care provider will recommend antibiotics through an IV during labor. After delivery, your baby will be evaluated for complications related to potential GBS infection and may  require antibiotics to prevent a serious infection. This information is not intended to replace advice given to you by your health care provider. Make sure you discuss any questions you have with your health care provider. Document Revised: 10/25/2019 Document Reviewed: 07/19/2018 Elsevier Patient Education  2023 [employer]  

## 2022-05-09 NOTE — Progress Notes (Signed)
Subjective:    Jeanette [name] is a 32 y.o. G2P1001 [redacted]w[redacted]d being seen today for her obstetrical visit.  Patient reports no complaints. Fetal movement: normal.  Objective:    BP 112/62   Pulse (!) 59   Wt 179 lb 9.6 oz (81.5 kg)   LMP 08/28/2021 (Exact Date)   BMI 29.89 kg/m   Physical Exam Vitals and nursing note reviewed.  Constitutional:      Appearance: Normal appearance.  Cardiovascular:     Rate and Rhythm: Normal rate.  Pulmonary:     Effort: Pulmonary effort is normal.  Skin:    General: Skin is warm and dry.  Neurological:     General: No focal deficit present.     Mental Status: She is alert and oriented to person, place, and time.  Psychiatric:        Mood and Affect: Mood normal.        Behavior: Behavior normal.       FHT: Fetal Heart Rate (bpm): 135  Uterine Size: Fundal Height: 36 cm  Presentation: Presentation: Vertex     Assessment:    Pregnancy:  G2P1001 at [redacted]w[redacted]d.     Plan:    Patient Active Problem List   Diagnosis Date Noted   Rh negative state in antepartum period 11/15/2021   Supervision of other normal pregnancy, antepartum 11/01/2021   Annual physical exam 06/24/2019   Recurrent UTI 04/01/2017   GBS/GC collected today. Labor call signs reviewed.  Infant feeding: plans to breastfeed. Follow up in 1 Week.

## 2022-05-12 LAB — CERVICOVAGINAL ANCILLARY ONLY
Chlamydia: NEGATIVE
Comment: NEGATIVE
Comment: NORMAL
Neisseria Gonorrhea: NEGATIVE

## 2022-05-13 LAB — CULTURE, BETA STREP (GROUP B ONLY): Strep Gp B Culture: NEGATIVE

## 2022-05-14 ENCOUNTER — Ambulatory Visit (INDEPENDENT_AMBULATORY_CARE_PROVIDER_SITE_OTHER): Payer: Commercial Managed Care - PPO | Admitting: Obstetrics

## 2022-05-14 VITALS — BP 118/65 | HR 65 | Wt 182.1 lb

## 2022-05-14 DIAGNOSIS — Z3A37 37 weeks gestation of pregnancy: Secondary | ICD-10-CM

## 2022-05-14 DIAGNOSIS — Z3483 Encounter for supervision of other normal pregnancy, third trimester: Secondary | ICD-10-CM

## 2022-05-14 NOTE — Progress Notes (Signed)
Routine Prenatal Care Visit  Subjective  Jeanette [name] is a 32 y.o. G2P1001 at [redacted]w[redacted]d being seen today for ongoing prenatal care.  She is currently monitored for the following issues for this low-risk pregnancy and has Recurrent UTI; Annual physical exam; Supervision of other normal pregnancy, antepartum; and Rh negative state in antepartum period on their problem list.  ----------------------------------------------------------------------------------- Patient reports no complaints.  Her baby is moving well. She usually labors unmedicated. Fast last labor. Contractions: Irregular. Vag. Bleeding: None.  Movement: Present. Leaking Fluid denies.  ----------------------------------------------------------------------------------- The following portions of the patient's history were reviewed and updated as appropriate: allergies, current medications, past family history, past medical history, past social history, past surgical history and problem list. Problem list updated.  Objective  Last menstrual period 08/28/2021, unknown if currently breastfeeding. Pregravid weight 135 lb (61.2 kg) Total Weight Gain 44 lb 9.6 oz (20.2 kg) Urinalysis: Urine Protein    Urine Glucose    Fetal Status:     Movement: Present     General:  Alert, oriented and cooperative. Patient is in no acute distress.  Skin: Skin is warm and dry. No rash noted.   Cardiovascular: Normal heart rate noted  Respiratory: Normal respiratory effort, no problems with respiration noted  Abdomen: Soft, gravid, appropriate for gestational age. Pain/Pressure: Present     Pelvic:  Cervical exam deferred        Extremities: Normal range of motion.  Edema: Trace  Mental Status: Normal mood and affect. Normal behavior. Normal judgment and thought content.   Assessment   32 y.o. G2P1001 at [redacted]w[redacted]d by  06/04/2022, by Last Menstrual Period presenting for routine prenatal visit  Plan   second Problems (from 11/01/21 to present)     No  problems associated with this episode.        Term labor symptoms and general obstetric precautions including but not limited to vaginal bleeding, contractions, leaking of fluid and fetal movement were reviewed in detail with the patient. Please refer to After Visit Summary for other counseling recommendations.   Return in about 1 week (around 05/21/2022) for return OB.  Mirna Mires, CNM  05/14/2022 1:22 PM

## 2022-05-23 ENCOUNTER — Ambulatory Visit (INDEPENDENT_AMBULATORY_CARE_PROVIDER_SITE_OTHER): Payer: Commercial Managed Care - PPO | Admitting: Obstetrics

## 2022-05-23 VITALS — BP 106/60 | HR 72 | Wt 178.8 lb

## 2022-05-23 DIAGNOSIS — Z3A38 38 weeks gestation of pregnancy: Secondary | ICD-10-CM

## 2022-05-23 DIAGNOSIS — Z3483 Encounter for supervision of other normal pregnancy, third trimester: Secondary | ICD-10-CM

## 2022-05-23 LAB — POCT URINALYSIS DIPSTICK OB
Bilirubin, UA: NEGATIVE
Blood, UA: NEGATIVE
Glucose, UA: NEGATIVE
Ketones, UA: NEGATIVE
Leukocytes, UA: NEGATIVE
Nitrite, UA: NEGATIVE
POC,PROTEIN,UA: NEGATIVE
Spec Grav, UA: 1.01 (ref 1.010–1.025)
Urobilinogen, UA: 0.2 E.U./dL
pH, UA: 6 (ref 5.0–8.0)

## 2022-05-23 NOTE — Progress Notes (Signed)
Routine Prenatal Care Visit  Subjective  Jeanette [name] is a 32 y.o. G2P1001 at [redacted]w[redacted]d being seen today for ongoing prenatal care.  She is currently monitored for the following issues for this low-risk pregnancy and has Recurrent UTI; Annual physical exam; Supervision of other normal pregnancy, antepartum; and Rh negative state in antepartum period on their problem list.  ----------------------------------------------------------------------------------- Patient reports no complaints.   Contractions: Not present. Vag. Bleeding: None.  Movement: Present. Leaking Fluid denies.  ----------------------------------------------------------------------------------- The following portions of the patient's history were reviewed and updated as appropriate: allergies, current medications, past family history, past medical history, past social history, past surgical history and problem list. Problem list updated.  Objective  Blood pressure 106/60, pulse 72, weight 178 lb 12.8 oz (81.1 kg), last menstrual period 08/28/2021, unknown if currently breastfeeding. Pregravid weight 135 lb (61.2 kg) Total Weight Gain 43 lb 12.8 oz (19.9 kg) Urinalysis: Urine Protein    Urine Glucose    Fetal Status:     Movement: Present     General:  Alert, oriented and cooperative. Patient is in no acute distress.  Skin: Skin is warm and dry. No rash noted.   Cardiovascular: Normal heart rate noted  Respiratory: Normal respiratory effort, no problems with respiration noted  Abdomen: Soft, gravid, appropriate for gestational age. Pain/Pressure: Present     Pelvic:  Cervical exam deferred        Extremities: Normal range of motion.  Edema: Mild pitting, slight indentation  Mental Status: Normal mood and affect. Normal behavior. Normal judgment and thought content.   Assessment   32 y.o. G2P1001 at [redacted]w[redacted]d by  06/04/2022, by Last Menstrual Period presenting for routine prenatal visit  Plan   second Problems (from 11/01/21 to  present)     No problems associated with this episode.        Term labor symptoms and general obstetric precautions including but not limited to vaginal bleeding, contractions, leaking of fluid and fetal movement were reviewed in detail with the patient. Please refer to After Visit Summary for other counseling recommendations.  She prefers an unmedicated labor. Last labor was fast-   Return in about 1 week (around 05/30/2022) for return OB.  Mirna Mires, CNM  05/23/2022 9:18 AM

## 2022-05-28 DIAGNOSIS — Z0289 Encounter for other administrative examinations: Secondary | ICD-10-CM

## 2022-05-28 NOTE — Telephone Encounter (Signed)
This pt called in today and paid for her FMLA paper work.

## 2022-05-30 ENCOUNTER — Ambulatory Visit (INDEPENDENT_AMBULATORY_CARE_PROVIDER_SITE_OTHER): Payer: Commercial Managed Care - PPO | Admitting: Certified Nurse Midwife

## 2022-05-30 VITALS — BP 126/65 | HR 71 | Wt 184.4 lb

## 2022-05-30 DIAGNOSIS — O26899 Other specified pregnancy related conditions, unspecified trimester: Secondary | ICD-10-CM

## 2022-05-30 DIAGNOSIS — O360130 Maternal care for anti-D [Rh] antibodies, third trimester, not applicable or unspecified: Secondary | ICD-10-CM

## 2022-05-30 DIAGNOSIS — Z3480 Encounter for supervision of other normal pregnancy, unspecified trimester: Secondary | ICD-10-CM

## 2022-05-30 DIAGNOSIS — Z3A39 39 weeks gestation of pregnancy: Secondary | ICD-10-CM

## 2022-05-30 DIAGNOSIS — O480 Post-term pregnancy: Secondary | ICD-10-CM

## 2022-05-30 LAB — POCT URINALYSIS DIPSTICK
Bilirubin, UA: NEGATIVE
Blood, UA: NEGATIVE
Glucose, UA: NEGATIVE
Ketones, UA: NEGATIVE
Leukocytes, UA: NEGATIVE
Nitrite, UA: NEGATIVE
Protein, UA: NEGATIVE
Spec Grav, UA: 1.015
Urobilinogen, UA: 0.2 U/dL
pH, UA: 6.5

## 2022-05-30 NOTE — Patient Instructions (Signed)

## 2022-05-30 NOTE — Progress Notes (Signed)
   PRENATAL VISIT NOTE  Subjective:  Jeanette [name] is a 32 y.o. G2P1001 at [redacted]w[redacted]d being seen today for ongoing prenatal care.  She is currently monitored for the following issues for this low-risk pregnancy and has Recurrent UTI; Annual physical exam; Supervision of other normal pregnancy, antepartum; and Rh negative state in antepartum period on their problem list.  Patient reports occasional contractions.  Contractions: Irritability. Vag. Bleeding: None.  Movement: Present. Denies leaking of fluid.   The following portions of the patient's history were reviewed and updated as appropriate: allergies, current medications, past family history, past medical history, past social history, past surgical history and problem list.   Objective:   Vitals:   05/30/22 0953  BP: 126/65  Pulse: 71  Weight: 184 lb 6.4 oz (83.6 kg)   Total weight gain: 49 lb 6.4 oz (22.4 kg) Fetal Status: Fetal Heart Rate (bpm): 140 Fundal Height: 38 cm Movement: Present  Presentation: Vertex   General:  Alert, oriented and cooperative. Patient is in no acute distress.  Skin: Skin is warm and dry. No rash noted.   Cardiovascular: Normal heart rate noted  Respiratory: Normal respiratory effort, no problems with respiration noted  Abdomen: Soft, gravid, appropriate for gestational age.  Pain/Pressure: Present     Pelvic:  Dilation: 3 Effacement (%): 60 Station: -2  Extremities: Normal range of motion.     Mental Status: Normal mood and affect. Normal behavior. Normal judgment and thought content.   Assessment and Plan:  Pregnancy: G2P1001 at [redacted]w[redacted]d 1. Supervision of other normal pregnancy, antepartum  - POCT Urinalysis Dipstick  2. Rh negative state in antepartum period   3. [redacted] weeks gestation of pregnancy  - POCT Urinalysis Dipstick  Term labor symptoms and general obstetric precautions including but not limited to vaginal bleeding, contractions, leaking of fluid and fetal movement were reviewed in detail  with the patient. Please refer to After Visit Summary for other counseling recommendations.  Glad to be somewhat dilated, feeling occasional contractions, labor/call signs reinforced. Return in 1 week (on 06/06/2022) for ROB with ultrasound.  No future appointments.  Dominica Severin, CNM

## 2022-06-03 NOTE — Addendum Note (Signed)
Addended by: Kathlene Cote on: 06/03/2022 10:20 AM   Modules accepted: Orders

## 2022-06-05 ENCOUNTER — Ambulatory Visit (INDEPENDENT_AMBULATORY_CARE_PROVIDER_SITE_OTHER): Payer: Commercial Managed Care - PPO | Admitting: Licensed Practical Nurse

## 2022-06-05 ENCOUNTER — Ambulatory Visit (INDEPENDENT_AMBULATORY_CARE_PROVIDER_SITE_OTHER): Payer: Commercial Managed Care - PPO

## 2022-06-05 ENCOUNTER — Encounter: Payer: Self-pay | Admitting: Licensed Practical Nurse

## 2022-06-05 VITALS — BP 125/76 | HR 69 | Wt 184.0 lb

## 2022-06-05 VITALS — BP 125/76 | HR 69 | Ht 65.0 in | Wt 184.0 lb

## 2022-06-05 DIAGNOSIS — O480 Post-term pregnancy: Secondary | ICD-10-CM

## 2022-06-05 DIAGNOSIS — Z3480 Encounter for supervision of other normal pregnancy, unspecified trimester: Secondary | ICD-10-CM

## 2022-06-05 DIAGNOSIS — Z3A40 40 weeks gestation of pregnancy: Secondary | ICD-10-CM

## 2022-06-05 HISTORY — DX: Post-term pregnancy: O48.0

## 2022-06-05 NOTE — Progress Notes (Signed)
Routine Prenatal Care Visit  Subjective  Jeanette [name] is a 32 y.o. G2P1001 at [redacted]w[redacted]d being seen today for ongoing prenatal care.  She is currently monitored for the following issues for this low-risk pregnancy and has Recurrent UTI; Annual physical exam; Supervision of other normal pregnancy, antepartum; and Rh negative state in antepartum period on their problem list.  ----------------------------------------------------------------------------------- Patient reports no complaints.   Her mother will watch her daughter while in labor. Her husband will be her for labor support. They have a lot of family for PP support. Jeanette [name] will take 16 weeks off.  -Reviewed postdates POC, open to IOL after 41 wks, scheduled for June 7 at 0800, understands method of induction is based on cervical exam at that time. Declines sweep today  -RNST today, has BPP June 5.   Contractions: Irregular. Vag. Bleeding: None.  Movement: Present. Leaking Fluid denies.  ----------------------------------------------------------------------------------- The following portions of the patient's history were reviewed and updated as appropriate: allergies, current medications, past family history, past medical history, past social history, past surgical history and problem list. Problem list updated.  Objective  Blood pressure 125/76, pulse 69, weight 184 lb (83.5 kg), last menstrual period 08/28/2021, unknown if currently breastfeeding. Pregravid weight 135 lb (61.2 kg) Total Weight Gain 49 lb (22.2 kg) Urinalysis: Urine Protein    Urine Glucose    Fetal Status: Fetal Heart Rate (bpm): 138 Fundal Height: 40 cm Movement: Present  Presentation: Vertex  General:  Alert, oriented and cooperative. Patient is in no acute distress.  Skin: Skin is warm and dry. No rash noted.   Cardiovascular: Normal heart rate noted  Respiratory: Normal respiratory effort, no problems with respiration noted  Abdomen: Soft, gravid, appropriate for  gestational age. Pain/Pressure: Present     Pelvic:  Cervical exam performed Dilation: 3 Effacement (%): 60 Station: -1  Extremities: Normal range of motion.  Edema: Trace  Mental Status: Normal mood and affect. Normal behavior. Normal judgment and thought content.   Assessment   32 y.o. G2P1001 at [redacted]w[redacted]d by  06/04/2022, by Last Menstrual Period presenting for routine prenatal visit  Plan   second Problems (from 11/01/21 to present)     No problems associated with this episode.        Term labor symptoms and general obstetric precautions including but not limited to vaginal bleeding, contractions, leaking of fluid and fetal movement were reviewed in detail with the patient. Please refer to After Visit Summary for other counseling recommendations.   Return in about 1 week (around 06/12/2022) for ROB. Ok to be seen sooner for sweep   IOL June 7 at 0800  Carie Caddy, [place]  [facility]  06/05/22  8:57 AM

## 2022-06-05 NOTE — Progress Notes (Signed)
    NURSE VISIT NOTE  Subjective:    Patient ID: Mariel Brands, female    DOB: September 12, 1990, 32 y.o.   MRN: [id number]  HPI  Patient is a 32 y.o. G73P1001 female who presents for fetal monitoring per order from Carie Caddy, [place].   Objective:    BP 125/76   Pulse 69   Ht 5\' 5"  (1.651 m)   Wt 184 lb (83.5 kg)   LMP 08/28/2021 (Exact Date)   BMI 30.62 kg/m  Estimated Date of Delivery: 06/04/22  [redacted]w[redacted]d  Fetus A Non-Stress Test Interpretation for 06/05/22  Indication: Post Dates  Fetal Heart Rate A Mode: External Baseline Rate (A): 140 bpm Variability: Moderate Accelerations: 15 x 15 Decelerations: None Multiple birth?: No  Uterine Activity Mode: Toco Contraction Frequency (min): rare Contraction Duration (sec): 90 Contraction Quality: Mild Resting Time: Adequate  Interpretation (Fetal Testing) Nonstress Test Interpretation: Reactive Overall Impression: Reassuring for gestational age   Assessment:   1. Postmaturity pregnancy, 40-[redacted] weeks gestation   2. [redacted] weeks gestation of pregnancy      Plan:   Results reviewed and discussed with patient by  Carie Caddy, CNM.     Rocco Serene, LPN

## 2022-06-05 NOTE — Patient Instructions (Signed)

## 2022-06-06 ENCOUNTER — Encounter: Payer: Self-pay | Admitting: Licensed Practical Nurse

## 2022-06-08 ENCOUNTER — Encounter: Payer: Self-pay | Admitting: Obstetrics

## 2022-06-08 ENCOUNTER — Inpatient Hospital Stay
Admission: EM | Admit: 2022-06-08 | Discharge: 2022-06-10 | DRG: 807 | Disposition: A | Discharge status: Home / Self Care | Payer: Commercial Managed Care - PPO | Attending: Obstetrics | Admitting: Obstetrics

## 2022-06-08 ENCOUNTER — Other Ambulatory Visit: Payer: Self-pay

## 2022-06-08 DIAGNOSIS — O43123 Velamentous insertion of umbilical cord, third trimester: Secondary | ICD-10-CM | POA: Diagnosis not present

## 2022-06-08 DIAGNOSIS — O26899 Other specified pregnancy related conditions, unspecified trimester: Secondary | ICD-10-CM

## 2022-06-08 DIAGNOSIS — Z6791 Unspecified blood type, Rh negative: Secondary | ICD-10-CM

## 2022-06-08 DIAGNOSIS — O360130 Maternal care for anti-D [Rh] antibodies, third trimester, not applicable or unspecified: Secondary | ICD-10-CM | POA: Diagnosis not present

## 2022-06-08 DIAGNOSIS — Z3A40 40 weeks gestation of pregnancy: Secondary | ICD-10-CM

## 2022-06-08 DIAGNOSIS — Z6711 Type A blood, Rh negative: Secondary | ICD-10-CM

## 2022-06-08 DIAGNOSIS — O26893 Other specified pregnancy related conditions, third trimester: Secondary | ICD-10-CM | POA: Diagnosis not present

## 2022-06-08 DIAGNOSIS — O480 Post-term pregnancy: Secondary | ICD-10-CM | POA: Diagnosis not present

## 2022-06-08 DIAGNOSIS — Z3490 Encounter for supervision of normal pregnancy, unspecified, unspecified trimester: Secondary | ICD-10-CM | POA: Diagnosis present

## 2022-06-08 DIAGNOSIS — O4202 Full-term premature rupture of membranes, onset of labor within 24 hours of rupture: Secondary | ICD-10-CM | POA: Diagnosis not present

## 2022-06-08 DIAGNOSIS — Z3480 Encounter for supervision of other normal pregnancy, unspecified trimester: Secondary | ICD-10-CM

## 2022-06-08 LAB — CBC
HCT: 32.9 % — ABNORMAL LOW (ref 36.0–46.0)
Hemoglobin: 11 g/dL — ABNORMAL LOW (ref 12.0–15.0)
MCH: 28.6 pg (ref 26.0–34.0)
MCHC: 33.4 g/dL (ref 30.0–36.0)
MCV: 85.5 fL (ref 80.0–100.0)
Platelets: 190 10*3/uL (ref 150–400)
RBC: 3.85 MIL/uL — ABNORMAL LOW (ref 3.87–5.11)
RDW: 13.2 % (ref 11.5–15.5)
WBC: 10.1 10*3/uL (ref 4.0–10.5)
nRBC: 0 % (ref 0.0–0.2)

## 2022-06-08 LAB — TYPE AND SCREEN: Antibody Screen: POSITIVE

## 2022-06-08 MED ORDER — LACTATED RINGERS IV SOLN: 500.0000 mL | INTRAVENOUS | Status: DC | PRN

## 2022-06-08 MED ORDER — ONDANSETRON HCL 4 MG/2ML IJ SOLN: 4.0000 mg | Freq: Four times a day (QID) | INTRAMUSCULAR | Status: DC | PRN

## 2022-06-08 MED ORDER — LACTATED RINGERS IV SOLN: INTRAVENOUS | Status: DC

## 2022-06-08 MED ORDER — LIDOCAINE HCL (PF) 1 % IJ SOLN
30.0000 mL | INTRAMUSCULAR | Status: AC | PRN
  Administered 2022-06-08: 30 mL via SUBCUTANEOUS

## 2022-06-08 MED ORDER — LIDOCAINE HCL (PF) 1 % IJ SOLN
INTRAMUSCULAR | Status: AC
  Filled 2022-06-08: qty 30

## 2022-06-08 MED ORDER — OXYTOCIN BOLUS FROM INFUSION
333.0000 mL | Freq: Once | INTRAVENOUS | Status: AC
  Administered 2022-06-08: 333 mL via INTRAVENOUS

## 2022-06-08 MED ORDER — ACETAMINOPHEN 325 MG PO TABS: 650.0000 mg | ORAL_TABLET | ORAL | Status: DC | PRN

## 2022-06-08 MED ORDER — HYDROXYZINE HCL 25 MG PO TABS: 50.0000 mg | ORAL_TABLET | Freq: Four times a day (QID) | ORAL | Status: DC | PRN

## 2022-06-08 MED ORDER — OXYTOCIN BOLUS FROM INFUSION: 333.0000 mL | Freq: Once | INTRAVENOUS | Status: DC

## 2022-06-08 MED ORDER — SOD CITRATE-CITRIC ACID 500-334 MG/5ML PO SOLN: 30.0000 mL | ORAL | Status: DC | PRN

## 2022-06-08 MED ORDER — FENTANYL CITRATE (PF) 100 MCG/2ML IJ SOLN: 50.0000 ug | INTRAMUSCULAR | Status: DC | PRN

## 2022-06-08 MED ORDER — OXYTOCIN-SODIUM CHLORIDE 30-0.9 UT/500ML-% IV SOLN
2.5000 [IU]/h | INTRAVENOUS | Status: DC
  Filled 2022-06-08: qty 500

## 2022-06-08 MED ORDER — AMMONIA AROMATIC IN INHA
RESPIRATORY_TRACT | Status: AC
  Filled 2022-06-08: qty 10

## 2022-06-08 MED ORDER — MISOPROSTOL 200 MCG PO TABS
ORAL_TABLET | ORAL | Status: AC
  Filled 2022-06-08: qty 4

## 2022-06-08 NOTE — H&P (Signed)
History and Physical   HPI  Jeanette [name] is a 32 y.o. G2P1001 at [redacted]w[redacted]d Estimated Date of Delivery: 06/04/22 who is being admitted for SROM. She reports that she had a gush of fluid around 1600 today. She has continued to leak clear fluid and have noticed a small amount of bloody show. Her contractions have gotten progressively stronger and are about q4-5 minutes.. She endorses good fetal movement.   OB History  OB History  Gravida Para Term Preterm AB Living  2 1 1  0 0 1  SAB IAB Ectopic Multiple Live Births  0 0 0 0 1    # Outcome Date GA Lbr Len/2nd Weight Sex Delivery Anes PTL Lv  2 Current           1 Term 08/27/18 [redacted]w[redacted]d 15:14 / 00:28 2880 g F Vag-Spont Local  LIV     Name: LATOYIA, ORIS     Apgar1: 9  Apgar5: 9    PROBLEM LIST  Pregnancy complications or risks: Patient Active Problem List   Diagnosis Date Noted   Labor and delivery, indication for care 06/08/2022   Postmaturity pregnancy, 40-[redacted] weeks gestation 06/05/2022   [redacted] weeks gestation of pregnancy 06/05/2022   Rh negative state in antepartum period 11/15/2021   Supervision of other normal pregnancy, antepartum 11/01/2021   Recurrent UTI 04/01/2017    Prenatal labs and studies: ABO, Rh: A/Negative/-- (11/03 1610) Antibody: Negative (03/08 0950) Rubella: 4.51 (11/03 0917) RPR: Non Reactive (03/08 0950)  HBsAg: Negative (11/03 0917)  HIV: Non Reactive (03/08 0950)  RUE:[id number]/-- (05/03 1124)   Past Medical History:  Diagnosis Date   Type A blood, Rh negative    UTI (urinary tract infection)      Past Surgical History:  Procedure Laterality Date   WISDOM TOOTH EXTRACTION  02/2021   three;     Medications    Current Discharge Medication List     CONTINUE these medications which have NOT CHANGED   Details  ferrous sulfate 325 (65 FE) MG EC tablet Take 325 mg by mouth 1 day or 1 dose.    Prenatal Vit-Fe Fumarate-FA (PRENATAL 19) 29-1 MG CHEW Chew 2 tablets by mouth daily.    sodium  fluoride (SODIUM FLUORIDE 5000 PPM) 1.1 % GEL dental gel Brush twice a day and do not rinse Qty: 56 g, Refills: 12         Allergies  Patient has no known allergies.  Review of Systems  Constitutional: negative Respiratory: negative Cardiovascular: negative Gastrointestinal: negative Genitourinary:negative Behavioral/Psych: negative  Physical Exam  Temp 98.5 F (36.9 C) (Oral)   Ht 5\' 5"  (1.651 m)   Wt 84 kg   LMP 08/28/2021 (Exact Date)   BMI 30.82 kg/m   General: Lungs:  CTA B Cardio: RRR without M/R/G Abd: Soft, gravid, NT Presentation: cephalic  DTRs:  CERVIX: Dilation: 4.5 Effacement (%): 90 Cervical Position: Anterior Station: -1 Presentation: Vertex Exam by:: Quitman Livings, CNM  See Prenatal records for more detailed PE.     FHR:  Baseline: 145 Variability: moderate Accelerations: present Decelerations:none Toco: regular, every 3-4 minutes Category 1  Test Results  No results found for this or any previous visit (from the past 24 hour(s)). Group B Strep negative, Rh negative  Assessment   G2P1001 at [redacted]w[redacted]d Estimated Date of Delivery: 06/04/22  Reassuring maternal/fetal status. Ruptured membranes at term  Patient Active Problem List   Diagnosis Date Noted   Labor and delivery, indication for care 06/08/2022   Postmaturity  pregnancy, 40-[redacted] weeks gestation 06/05/2022   [redacted] weeks gestation of pregnancy 06/05/2022   Rh negative state in antepartum period 11/15/2021   Supervision of other normal pregnancy, antepartum 11/01/2021   Recurrent UTI 04/01/2017    Plan  1. Admit to L&D :   2. EFM: Intermittent auscultation after RNST 3. Plans unmedicated birth 4. Admission labs  5. Anticipate NSVD 6. Discussed augmentation of labor if contractions do not continue to progress. 7. Dr. Dalbert Garnet notified of admission  Guadlupe Spanish, [facility] 06/08/2022 9:54 PM

## 2022-06-09 ENCOUNTER — Encounter: Payer: Commercial Managed Care - PPO | Admitting: Obstetrics

## 2022-06-09 ENCOUNTER — Encounter: Payer: Self-pay | Admitting: Obstetrics

## 2022-06-09 DIAGNOSIS — O4202 Full-term premature rupture of membranes, onset of labor within 24 hours of rupture: Secondary | ICD-10-CM

## 2022-06-09 DIAGNOSIS — O480 Post-term pregnancy: Principal | ICD-10-CM

## 2022-06-09 DIAGNOSIS — Z3A40 40 weeks gestation of pregnancy: Secondary | ICD-10-CM

## 2022-06-09 DIAGNOSIS — O360130 Maternal care for anti-D [Rh] antibodies, third trimester, not applicable or unspecified: Secondary | ICD-10-CM

## 2022-06-09 LAB — CBC
HCT: 31.3 % — ABNORMAL LOW (ref 36.0–46.0)
Hemoglobin: 10.4 g/dL — ABNORMAL LOW (ref 12.0–15.0)
MCH: 28.5 pg (ref 26.0–34.0)
MCHC: 33.2 g/dL (ref 30.0–36.0)
MCV: 85.8 fL (ref 80.0–100.0)
Platelets: 159 10*3/uL (ref 150–400)
RBC: 3.65 MIL/uL — ABNORMAL LOW (ref 3.87–5.11)
RDW: 13 % (ref 11.5–15.5)
WBC: 15.1 10*3/uL — ABNORMAL HIGH (ref 4.0–10.5)
nRBC: 0 % (ref 0.0–0.2)

## 2022-06-09 LAB — TYPE AND SCREEN: ABO/RH(D): A NEG

## 2022-06-09 LAB — RPR: RPR Ser Ql: NONREACTIVE

## 2022-06-09 MED ORDER — PRENATAL MULTIVITAMIN CH
1.0000 | ORAL_TABLET | Freq: Every day | ORAL | Status: DC
  Administered 2022-06-09: 1 via ORAL
  Filled 2022-06-09: qty 1

## 2022-06-09 MED ORDER — IBUPROFEN 600 MG PO TABS: 600.0000 mg | ORAL_TABLET | Freq: Four times a day (QID) | ORAL | Status: DC

## 2022-06-09 MED ORDER — WITCH HAZEL-GLYCERIN EX PADS
MEDICATED_PAD | CUTANEOUS | Status: DC | PRN
  Filled 2022-06-09: qty 100

## 2022-06-09 MED ORDER — METHYLERGONOVINE MALEATE 0.2 MG/ML IJ SOLN: 0.2000 mg | INTRAMUSCULAR | Status: DC | PRN

## 2022-06-09 MED ORDER — BENZOCAINE-MENTHOL 20-0.5 % EX AERO
1.0000 | INHALATION_SPRAY | CUTANEOUS | Status: DC | PRN
  Administered 2022-06-09: 1 via TOPICAL
  Filled 2022-06-09: qty 56

## 2022-06-09 MED ORDER — COCONUT OIL OIL
1.0000 | TOPICAL_OIL | Status: DC | PRN
  Administered 2022-06-09: 1 via TOPICAL
  Filled 2022-06-09: qty 7.5

## 2022-06-09 MED ORDER — DIPHENHYDRAMINE HCL 25 MG PO CAPS: 25.0000 mg | ORAL_CAPSULE | Freq: Four times a day (QID) | ORAL | Status: DC | PRN

## 2022-06-09 MED ORDER — METHYLERGONOVINE MALEATE 0.2 MG PO TABS: 0.2000 mg | ORAL_TABLET | ORAL | Status: DC | PRN

## 2022-06-09 MED ORDER — DOCUSATE SODIUM 100 MG PO CAPS
100.0000 mg | ORAL_CAPSULE | Freq: Two times a day (BID) | ORAL | Status: DC
  Administered 2022-06-09 – 2022-06-10 (×3): 100 mg via ORAL
  Filled 2022-06-09 (×4): qty 1

## 2022-06-09 MED ORDER — OXYCODONE-ACETAMINOPHEN 5-325 MG PO TABS: 1.0000 | ORAL_TABLET | ORAL | Status: DC | PRN

## 2022-06-09 MED ORDER — IBUPROFEN 600 MG PO TABS
600.0000 mg | ORAL_TABLET | Freq: Four times a day (QID) | ORAL | Status: DC
  Administered 2022-06-09 – 2022-06-10 (×6): 600 mg via ORAL
  Filled 2022-06-09 (×6): qty 1

## 2022-06-09 MED ORDER — OXYTOCIN-SODIUM CHLORIDE 30-0.9 UT/500ML-% IV SOLN: 2.5000 [IU]/h | INTRAVENOUS | Status: DC | PRN

## 2022-06-09 MED ORDER — TETANUS-DIPHTH-ACELL PERTUSSIS 5-2.5-18.5 LF-MCG/0.5 IM SUSY: 0.5000 mL | PREFILLED_SYRINGE | Freq: Once | INTRAMUSCULAR | Status: DC

## 2022-06-09 MED ORDER — ACETAMINOPHEN 325 MG PO TABS: 650.0000 mg | ORAL_TABLET | ORAL | Status: DC | PRN

## 2022-06-09 MED ORDER — SIMETHICONE 80 MG PO CHEW: 80.0000 mg | CHEWABLE_TABLET | ORAL | Status: DC | PRN

## 2022-06-09 NOTE — Progress Notes (Signed)
Subjective:   Jeanette [name] had a NSVB on 06/08/22. She arrived in active labor, progressed quickly and had a precipitous birth but otherwise was uncomplicated. Pregnancy complicated by Rh negative status, did receive rhogam prenatally. EBL was , had second degree perineal laceration with repair.   Pt. Is eating, hydrating, and voiding regularly without difficulty. Has yet to have BM. She is breastfeeding, denies difficulty with latching or nipple pain. Reports small amount of vaginal bleeding, denies passing large blood clots. Has had cramping abdomen pain relieved with ibuprofen. Partner plans vasectomy for contraception, was offered bridge contraception to use until partner's f/u visit to ensure success of procedure, was declined. Denies anxiety/depression symptoms. Endorses good support from partner and family.   Objective:  Vital signs in last 24 hours: Temp:  [97.9 F (36.6 C)-98.9 F (37.2 C)] 98.9 F (37.2 C) (06/03 1137) Pulse Rate:  [59-83] 67 (06/03 1137) Resp:  [18-20] 20 (06/03 1137) BP: (115-133)/(64-79) 115/68 (06/03 1137) SpO2:  [96 %-99 %] 98 % (06/03 1137) Weight:  [84 kg] 84 kg (06/02 2133)    General: NAD Pulmonary: no increased work of breathing Abdomen: soft, non-tender Fundus: firm, midline, at umbilicus Lochia: light rubra, no clots Perineum: no erythema or foul odor discharge, minimal edema, laceration well approximated  Extremities: no edema, no erythema, no tenderness  Results for orders placed or performed during the hospital encounter of 06/08/22 (from the past 72 hour(s))  CBC     Status: Abnormal   Collection Time: 06/08/22  9:57 PM  Result Value Ref Range   WBC 10.1 4.0 - 10.5 K/uL   RBC 3.85 (L) 3.87 - 5.11 MIL/uL   Hemoglobin 11.0 (L) 12.0 - 15.0 g/dL   HCT 78.2 (L) 95.6 - 21.3 %   MCV 85.5 80.0 - 100.0 fL   MCH 28.6 26.0 - 34.0 pg   MCHC 33.4 30.0 - 36.0 g/dL   RDW 08.6 57.8 - 46.9 %   Platelets 190 150 - 400 K/uL   nRBC 0.0 0.0 - 0.2 %     Comment: Performed at [facility], [address] [place], [place] [address]  Type and screen     Status: None   Collection Time: 06/08/22  9:57 PM  Result Value Ref Range   ABO/RH(D) A NEG    Antibody Screen POS    Sample Expiration 06/11/2022,2359    Antibody Identification      PASSIVELY ACQUIRED ANTI-D Performed at [facility], [address] [place], [place] [address]   RPR     Status: None   Collection Time: 06/08/22  9:57 PM  Result Value Ref Range   RPR Ser Ql NON REACTIVE NON REACTIVE    Comment: Performed at [facility], [address], [place], [place] [address]  CBC     Status: Abnormal   Collection Time: 06/09/22  5:58 AM  Result Value Ref Range   WBC 15.1 (H) 4.0 - 10.5 K/uL   RBC 3.65 (L) 3.87 - 5.11 MIL/uL   Hemoglobin 10.4 (L) 12.0 - 15.0 g/dL   HCT 27.2 (L) 53.6 - 64.4 %   MCV 85.8 80.0 - 100.0 fL   MCH 28.5 26.0 - 34.0 pg   MCHC 33.2 30.0 - 36.0 g/dL   RDW 03.4 74.2 - 59.5 %   Platelets 159 150 - 400 K/uL   nRBC 0.0 0.0 - 0.2 %    Comment: Performed at [facility], [address], [place], [place]  [address]    Assessment:   32 y.o. G2P2002 1 day(s)  s/p NSVB Mother Rh neg/ Baby also Rh neg- rhogam not indicated Breastfeeding Anemia secondary to acute blood loss- hemodynamically stable and symptomatic VSS Pain well controlled  Plan:    PO Fe (in PNV) Blood Type --/--/A NEG (06/02 2157) / Rubella 4.51 (11/03 0917) / Varicella Immune Rhogam not indicated Tdap/varicella/rubella not indicated Feeding plan breast, lactation support Encouraged to continue breastfeeding, BF education on latch, position changes, cluster feeding, hunger cues, lactogenesis II, milk supply Education given regarding options for contraception, as well as compatibility with breast feeding if applicable.  Patient plans on vasectomy for contraception. Continued routine postpartum care  Counseled on normal uterine involution and vaginal  bleeding postpartum Anticipate discharge home tomorrow as baby will not be 24hr old until late tonight    Raeford Razor, FNP, CNM [facility] 06/09/2022, 1:16 PM

## 2022-06-09 NOTE — Lactation Note (Addendum)
This note was copied from a baby's chart. Lactation Consultation Note  Patient Name: Jeanette [name] [id number] Date: 06/09/2022 Age:30 hours Reason for consult: Initial assessment;Term   Maternal Data Has patient been taught Hand Expression?: Yes Does the patient have breastfeeding experience prior to this delivery?: Yes How long did the patient breastfeed?: 2 yrs  Feeding Mother's Current Feeding Choice: Breast Milk BAby has been gaggy and spitty today, encouraged mom to offer breast every 2-3 hr or 8-12 x/24hrs, baby latched easily to left breast and nursed 10 min , then encouraged mom to offer right breast after burping and then hand expressing, baby latched readily and nursed well with swallows noted, occ stimulation needed.    LATCH Score Latch: Grasps breast easily, tongue down, lips flanged, rhythmical sucking.  Audible Swallowing: Spontaneous and intermittent  Type of Nipple: Everted at rest and after stimulation  Comfort (Breast/Nipple): Soft / non-tender  Hold (Positioning): Assistance needed to correctly position infant at breast and maintain latch.  LATCH Score: 9   Lactation Tools Discussed/Used  LC name and no written on white board   Interventions Interventions: Breast feeding basics reviewed;Assisted with latch;Hand express;Support pillows;Coconut oil;Education  Discharge Pump: Employee Pump;Advised to call insurance company [facility] Program: No Mom given [facility] Flex and appropriate documentation completed Consult Status Consult Status: PRN    Dyann Kief 06/09/2022, 3:37 PM

## 2022-06-09 NOTE — Discharge Summary (Signed)
Postpartum Discharge Summary  Date of Service updated: 06/10/2022     Patient Name: Jeanette [name] DOB: December 13, 1990 MRN: [id number]  Date of admission: 06/08/2022 Delivery date:06/08/2022  Delivering provider: Glenetta Borg  Date of discharge: 06/10/2022  Admitting diagnosis: Labor and delivery, indication for care [O75.9] Encounter for induction of labor [Z34.90] Intrauterine pregnancy: [redacted]w[redacted]d     Secondary diagnosis:  Principal Problem:   Labor and delivery, indication for care Active Problems:   Postpartum care following vaginal delivery   Rh negative state in antepartum period   Postmaturity pregnancy, 40-[redacted] weeks gestation   Encounter for care or examination of lactating mother  Additional problems: None    Discharge diagnosis: Term Pregnancy Delivered                                              Post partum procedures: NA Augmentation: N/A Complications: None  Hospital course: Onset of Labor With Vaginal Delivery      32 y.o. yo [id number] at [redacted]w[redacted]d was admitted in Active Labor on 06/08/2022. Labor course was uncomplicated.  Membrane Rupture Time/Date: 4:00 PM ,06/08/2022   Delivery Method:Vaginal, Spontaneous  Episiotomy: none Lacerations:  2nd degree  See delivery note for details  Patient had a postpartum course complicated by NA.  She is ambulating, tolerating a regular diet, passing flatus, and urinating well. She reports breastfeeding is going well.  Patient is discharged home in stable condition on 06/10/22.  Newborn Data: Birth date:06/08/2022  Birth time:10:49 PM  Gender:Female   Woody Living status:Living  Apgars:7 ,9  Weight:3310 g   Magnesium Sulfate received: No BMZ received: No Rhophylac:No (baby also Rh negative) MMR:N/A T-DaP:Given prenatally Flu: N/A Transfusion:No  Physical exam  Vitals:   06/09/22 1137 06/09/22 1557 06/09/22 2313 06/10/22 0825  BP: 115/68 (!) 106/59 105/63 117/74  Pulse: 67 60 69 65  Resp: 20 20 20 18   Temp: 98.9 F (37.2 C)  97.9 F (36.6 C) 98.1 F (36.7 C) 98 F (36.7 C)  TempSrc: Oral Oral Oral   SpO2: 98% 98% 98% 96%  Weight:      Height:       General: alert, cooperative, and no distress Lochia: appropriate Uterine Fundus: firm Incision: N/A DVT Evaluation: No evidence of DVT seen on physical exam. Labs: Lab Results  Component Value Date   WBC 15.1 (H) 06/09/2022   HGB 10.4 (L) 06/09/2022   HCT 31.3 (L) 06/09/2022   MCV 85.8 06/09/2022   PLT 159 06/09/2022      Latest Ref Rng & Units 06/30/2017    2:00 AM  CMP  Glucose 70 - 99 mg/dL 81   BUN 6 - 20 mg/dL 17   Creatinine 6.29 - 1.00 mg/dL 5.28   Sodium 413 - 244 mmol/L 135   Potassium 3.5 - 5.1 mmol/L 3.4   Chloride 98 - 111 mmol/L 102   CO2 22 - 32 mmol/L 23   Calcium 8.9 - 10.3 mg/dL 9.4   Total Protein 6.5 - 8.1 g/dL 7.8   Total Bilirubin 0.3 - 1.2 mg/dL 1.0   Alkaline Phos 38 - 126 U/L 53   AST 15 - 41 U/L 24   ALT 0 - 44 U/L 25    Edinburgh Score:    06/09/2022    1:20 AM  Edinburgh Postnatal Depression Scale Screening Tool  I have been able  to laugh and see the funny side of things. 0  I have looked forward with enjoyment to things. 0  I have blamed myself unnecessarily when things went wrong. 2  I have been anxious or worried for no good reason. 2  I have felt scared or panicky for no good reason. 1  Things have been getting on top of me. 1  I have been so unhappy that I have had difficulty sleeping. 0  I have felt sad or miserable. 0  I have been so unhappy that I have been crying. 0  The thought of harming myself has occurred to me. 0  Edinburgh Postnatal Depression Scale Total 6      After visit meds:  Allergies as of 06/10/2022   No Known Allergies      Medication List     TAKE these medications    ferrous sulfate 325 (65 FE) MG EC tablet Take 325 mg by mouth 1 day or 1 dose.   Prenatal 19 29-1 MG Chew Chew 2 tablets by mouth daily.   sodium fluoride 1.1 % Gel dental gel Commonly known as: Sodium  Fluoride 5000 PPM Brush twice a day and do not rinse         Discharge home in stable condition Infant Feeding: Breast Infant Disposition:home with mother Discharge instruction: per After Visit Summary and Postpartum booklet. Activity: Advance as tolerated. Pelvic rest for 6 weeks.  Diet: routine diet Anticipated Birth Control:  vasectomy Postpartum Appointment: 2 weeks and 6 weeks Additional Postpartum F/U:  PRN Future Appointments: No future appointments.  Follow up Visit:  Follow-up Information     Glenetta Borg, CNM. Schedule an appointment as soon as possible for a visit.   Specialty: Obstetrics Why: Video visit in 2 weeks Office visit in 6 weeks Contact information: [address] [place] [address] [phone]                   06/10/2022 Tresea Mall, CNM

## 2022-06-10 NOTE — Progress Notes (Signed)
Patient discharged home with family.  Discharge instructions, when to follow up, and medications reviewed with patient.  Patient verbalized understanding. Patient will be escorted out by auxiliary.   

## 2022-06-10 NOTE — Discharge Instructions (Signed)

## 2022-06-11 ENCOUNTER — Other Ambulatory Visit: Payer: Commercial Managed Care - PPO

## 2022-07-23 ENCOUNTER — Telehealth: Payer: Self-pay

## 2022-07-25 ENCOUNTER — Ambulatory Visit: Payer: Commercial Managed Care - PPO | Admitting: Obstetrics

## 2022-07-25 ENCOUNTER — Ambulatory Visit (INDEPENDENT_AMBULATORY_CARE_PROVIDER_SITE_OTHER): Payer: Commercial Managed Care - PPO | Admitting: Obstetrics

## 2022-07-25 ENCOUNTER — Encounter: Payer: Self-pay | Admitting: Obstetrics

## 2022-07-25 DIAGNOSIS — Z3009 Encounter for other general counseling and advice on contraception: Secondary | ICD-10-CM

## 2022-07-25 NOTE — Progress Notes (Signed)
Post Partum Visit Note  Jeanette [name] is a 32 y.o. [id number] female who presents for a postpartum visit. She is 6 weeks postpartum following a normal spontaneous vaginal delivery.  I have fully reviewed the prenatal and intrapartum course and was present at the birth. The delivery was at [redacted]w[redacted]d.  Anesthesia:  local for repair . Postpartum course has been uncomplicated. Baby is doing well and gaining weight. Baby is feeding by breast. Bleeding:  none . Bowel function is normal. Bladder function is normal. Patient is not sexually active. Contraception method:  plans vasectomy . Postpartum depression screening: negative.   The pregnancy intention screening data noted above was reviewed. Potential methods of contraception were discussed. The patient plans vasectomy.  Edinburgh Postnatal Depression Scale - 07/25/22 1107       Edinburgh Postnatal Depression Scale:  In the Past 7 Days   I have been able to laugh and see the funny side of things. 0    I have looked forward with enjoyment to things. 0    I have blamed myself unnecessarily when things went wrong. 1    I have been anxious or worried for no good reason. 1    I have felt scared or panicky for no good reason. 1    Things have been getting on top of me. 0    I have been so unhappy that I have had difficulty sleeping. 0    I have felt sad or miserable. 0    I have been so unhappy that I have been crying. 0    The thought of harming myself has occurred to me. 0    Edinburgh Postnatal Depression Scale Total 3             Health Maintenance Due  Topic Date Due   COVID-19 Vaccine (1) Never done    The following portions of the patient's history were reviewed and updated as appropriate: allergies, current medications, and problem list.  Review of Systems Pertinent items noted in HPI and remainder of comprehensive ROS otherwise negative.  Objective:  BP 118/76   Pulse 64   Wt 159 lb 11.2 oz (72.4 kg)   LMP 08/28/2021 (Exact  Date)   Breastfeeding Yes   BMI 26.58 kg/m    General:  alert, cooperative, and appears stated age   Breasts:  normal  Lungs: clear to auscultation bilaterally  Heart:  regular rate and rhythm, S1, S2 normal, no murmur, click, rub or gallop  Abdomen: soft, non-tender; bowel sounds normal; no masses,  no organomegaly   Wound:  Laceration well-healed  GU exam:  normal       Assessment:    1. 6-week postpartum visit  2. Breastfeeding  3. Plans vasectomy for contraception  Plan:   Essential components of care per ACOG recommendations:  1.  Mood and well being: Patient with negative depression screening today. Reviewed local resources for support.  - Patient tobacco use? No.   - hx of drug use? No.    2. Infant care and feeding:  -Patient currently breastmilk feeding? Yes. Discussed returning to work and pumping. Reviewed importance of draining breast regularly to support lactation.  -Social determinants of health (SDOH) reviewed in EPIC. No concerns.  3. Sexuality, contraception and birth spacing - Patient does not want a pregnancy in the next year.  Desired family size is 2 children.  - Reviewed reproductive life planning. Reviewed contraceptive methods based on pt preferences and effectiveness. Plans vasectomy. Discussed using  barrier method until 3 months post-vasectomy.  4. Sleep and fatigue -Encouraged family/partner/community support of 4 hrs of uninterrupted sleep to help with mood and fatigue  5. Physical Recovery  - Discussed patients delivery and complications. She describes her labor as good. - Patient had a  vaginal birth . Patient had a 2nd degree laceration. Perineal healing reviewed. Patient expressed understanding - Patient has urinary incontinence? No. - Patient is safe to resume physical and sexual activity  6.  Health Maintenance - HM due items addressed No - N/A - Last pap smear  Diagnosis  Date Value Ref Range Status  08/28/2021   Final   - Negative  for intraepithelial lesion or malignancy (NILM)   Pap smear not done at today's visit.  -Breast Cancer screening indicated? No.   7. Chronic Disease/Pregnancy Condition follow up: None  - PCP follow up  Glenetta Borg, CNM [facility]

## 2022-08-18 ENCOUNTER — Ambulatory Visit: Payer: Commercial Managed Care - PPO | Admitting: Obstetrics

## 2023-08-14 ENCOUNTER — Ambulatory Visit (INDEPENDENT_AMBULATORY_CARE_PROVIDER_SITE_OTHER): Admitting: Family Medicine

## 2023-08-14 ENCOUNTER — Encounter: Payer: Self-pay | Admitting: Family Medicine

## 2023-08-14 VITALS — BP 112/78 | HR 72 | Resp 16 | Ht 65.0 in | Wt 145.0 lb

## 2023-08-14 DIAGNOSIS — Z0000 Encounter for general adult medical examination without abnormal findings: Secondary | ICD-10-CM

## 2023-08-14 NOTE — Progress Notes (Signed)
 Subjective:   Jeanette [name] 1990/04/07  08/14/2023   CC: Chief Complaint  Patient presents with   Establish Care   HPI: Jeanette [name] is a 33 y.o. female who presents for a routine health maintenance exam and to establish as a new patient. She does not have any concerns today.  Diet- healthy options  Exercise- works out 1-2x per week   Fasting labs collected at time of visit.   CC: Patient here to establish care  Last PCP: [facility]  Specialists: OBGYN, derm  PMHx: none   HEALTH SCREENINGS: - Vision Screening: discussed baseline eye exam  - Dental Visits: up to date - Pap smear: up to date - Breast Exam: up to date - STD Screening: Declined - Mammogram (40+): Not applicable  - Colonoscopy (45+): Not applicable  - Bone Density (65+ or under 65 with predisposing conditions): Not applicable  - Lung CA screening with low-dose CT:  Not applicable Adults age 67-80 who are current cigarette smokers or quit within the last 15 years. Must have 20 pack year history.   Depression and Anxiety Screen done today and results listed below:     06/24/2019    2:53 PM  Depression screen PHQ 2/9  Decreased Interest 0  Down, Depressed, Hopeless 0  PHQ - 2 Score 0       No data to display         IMMUNIZATIONS: - Tdap: Tetanus vaccination status reviewed: last tetanus booster within 10 years. - HPV: declined  - Influenza: N/A - Pneumovax: Not applicable - Prevnar 20: Not applicable - Zostavax (50+): Not applicable  Past medical history, surgical history, medications, allergies, family history and social history reviewed with patient today and changes made to appropriate areas of the chart.   Past Medical History:  Diagnosis Date   Postmaturity pregnancy, 40-[redacted] weeks gestation 06/05/2022   Recurrent UTI 04/01/2017   Supervision of other normal pregnancy, antepartum 11/01/2021              Clinical Staff    Provider      Office Location     [facility]    Dating      LMP 08/28/2021      Language     English    Anatomy US      Normal      Flu Vaccine     Declined    Genetic Screen     NIPS: declined 11/10      TDaP vaccine      03/14/2022    Hgb A1C or   GTT    Early :  Third trimester : 10      Covid    declines         LAB RESULTS       Rhogam     A/Negative/-- (11/03 091   Type A blood, Rh negative    UTI (urinary tract infection)     Past Surgical History:  Procedure Laterality Date   WISDOM TOOTH EXTRACTION  02/2021   three;    Current Outpatient Medications on File Prior to Visit  Medication Sig   Multiple Vitamin (MULTIVITAMIN) tablet Take 1 tablet by mouth daily.   ferrous sulfate  325 (65 FE) MG EC tablet Take 325 mg by mouth 1 day or 1 dose. (Patient not taking: Reported on 08/14/2023)   Prenatal Vit-Fe Fumarate-FA (PRENATAL 19) 29-1 MG CHEW Chew 2 tablets by mouth daily. (Patient not taking: Reported on 08/14/2023)   sodium  fluoride  (SODIUM FLUORIDE  5000 PPM) 1.1 % GEL dental gel Brush twice a day and do not rinse   No current facility-administered medications on file prior to visit.   No Known Allergies   Social History   Socioeconomic History   Marital status: Married    Spouse name: Lang   Number of children: 1   Years of education: 14   Highest education level: Not on file  Occupational History    Employer: [employer]  Tobacco Use   Smoking status: Never    Passive exposure: Never   Smokeless tobacco: Never  Vaping Use   Vaping status: Never Used  Substance and Sexual Activity   Alcohol use: Not Currently    Comment: occasionally   Drug use: No   Sexual activity: Yes    Partners: Male    Birth control/protection: None  Other Topics Concern   Not on file  Social History Narrative   ** Merged History Encounter **       Social Drivers of Health   Financial Resource Strain: Low Risk  (11/01/2021)   Overall Financial Resource Strain (CARDIA)    Difficulty of Paying Living Expenses: Not hard at all  Food Insecurity: No  Food Insecurity (06/08/2022)   Hunger Vital Sign    Worried About Running Out of Food in the Last Year: Never true    Ran Out of Food in the Last Year: Never true  Transportation Needs: No Transportation Needs (06/08/2022)   PRAPARE - Administrator, Civil Service (Medical): No    Lack of Transportation (Non-Medical): No  Physical Activity: Insufficiently Active (11/01/2021)   Exercise Vital Sign    Days of Exercise per Week: 2 days    Minutes of Exercise per Session: 40 min  Stress: No Stress Concern Present (11/01/2021)   [employer] of Occupational Health - Occupational Stress Questionnaire    Feeling of Stress : Not at all  Social Connections: Moderately Integrated (11/01/2021)   Social Connection and Isolation Panel    Frequency of Communication with Friends and Family: Three times a week    Frequency of Social Gatherings with Friends and Family: Three times a week    Attends Religious Services: More than 4 times per year    Active Member of Clubs or Organizations: No    Attends Banker Meetings: Never    Marital Status: Married  Catering manager Violence: Not At Risk (06/08/2022)   Humiliation, Afraid, Rape, and Kick questionnaire    Fear of Current or Ex-Partner: No    Emotionally Abused: No    Physically Abused: No    Sexually Abused: No   Social History   Tobacco Use  Smoking Status Never   Passive exposure: Never  Smokeless Tobacco Never   Social History   Substance and Sexual Activity  Alcohol Use Not Currently   Comment: occasionally    Family History  Problem Relation Age of Onset   Hypertension Father    Epilepsy Sister    ADD / ADHD Brother    Hyperlipidemia Maternal Grandmother    Hypertension Maternal Grandfather    Thyroid  disease Maternal Grandfather    Heart murmur Maternal Grandfather    Diabetes Paternal Grandfather    Congestive Heart Failure Paternal Grandfather    Colon cancer Other 60     ROS: Denies fever,  fatigue, unexplained weight loss/gain, chest pain, SHOB, and palpitations. Denies neurological deficits, gastrointestinal or genitourinary complaints, and skin changes.   Objective:  Today's Vitals   08/14/23 0858  BP: 112/78  Pulse: 72  Resp: 16  Weight: 145 lb (65.8 kg)  Height: 5' 5 (1.651 m)  PainSc: 0-No pain    GENERAL APPEARANCE: Well-appearing, in NAD. Well nourished.  SKIN: Pink, warm and dry. Turgor normal. No rash, lesion, ulceration, or ecchymoses. Hair evenly distributed.  HEENT: HEAD: Normocephalic.  EYES: PERRLA. EOMI. Lids intact w/o defect. Sclera white, Conjunctiva pink w/o exudate.  EARS: External ear w/o redness, swelling, masses or lesions. EAC clear. TM's intact, translucent w/o bulging, appropriate landmarks visualized. Appropriate acuity to conversational tones.  NOSE: Septum midline w/o deformity. Nares patent, mucosa pink and non-inflamed w/o drainage. No sinus tenderness.  THROAT: Uvula midline. Oropharynx clear. Tonsils non-inflamed w/o exudate. Oral mucosa pink and moist.  NECK: Supple, Trachea midline. Full ROM w/o pain or tenderness. No lymphadenopathy. Thyroid  non-tender w/o enlargement or palpable masses.  BREASTS: Deferred.  RESPIRATORY: Chest wall symmetrical w/o masses. Respirations even and non-labored. Breath sounds clear to auscultation bilaterally. No wheezes, rales, rhonchi, or crackles. CARDIAC: S1, S2 present, regular rate and rhythm. No gallops, murmurs, rubs, or clicks. PMI w/o lifts, heaves, or thrills. No carotid bruits. Capillary refill <2 seconds. Peripheral pulses 2+ bilaterally. GI: Abdomen soft w/o distention. Normoactive bowel sounds. No palpable masses or tenderness. No guarding or rebound tenderness. Liver and spleen w/o tenderness or enlargement. No CVA tenderness.  GU: Deferred.  MSK: Muscle tone and strength appropriate for age, w/o atrophy or abnormal movement.  EXTREMITIES: Active ROM intact, w/o tenderness, crepitus, or  contracture. No obvious joint deformities or effusions. No clubbing, edema, or cyanosis.  NEUROLOGIC: CN's II-XII intact. Motor strength symmetrical with no obvious weakness. No sensory deficits. DTR's 2+ symmetric bilaterally. Steady, even gait.  PSYCH/MENTAL STATUS: Alert, oriented x 3. Cooperative, appropriate mood and affect.   Results for orders placed or performed during the hospital encounter of 06/08/22  CBC   Collection Time: 06/08/22  9:57 PM  Result Value Ref Range   WBC 10.1 4.0 - 10.5 K/uL   RBC 3.85 (L) 3.87 - 5.11 MIL/uL   Hemoglobin 11.0 (L) 12.0 - 15.0 g/dL   HCT 67.0 (L) 63.9 - 53.9 %   MCV 85.5 80.0 - 100.0 fL   MCH 28.6 26.0 - 34.0 pg   MCHC 33.4 30.0 - 36.0 g/dL   RDW 86.7 88.4 - 84.4 %   Platelets 190 150 - 400 K/uL   nRBC 0.0 0.0 - 0.2 %  RPR   Collection Time: 06/08/22  9:57 PM  Result Value Ref Range   RPR Ser Ql NON REACTIVE NON REACTIVE  Type and screen   Collection Time: 06/08/22  9:57 PM  Result Value Ref Range   ABO/RH(D) A NEG    Antibody Screen POS    Sample Expiration 06/11/2022,2359    Antibody Identification      PASSIVELY ACQUIRED ANTI-D Performed at [facility], [address] [place], [place] [address]   CBC   Collection Time: 06/09/22  5:58 AM  Result Value Ref Range   WBC 15.1 (H) 4.0 - 10.5 K/uL   RBC 3.65 (L) 3.87 - 5.11 MIL/uL   Hemoglobin 10.4 (L) 12.0 - 15.0 g/dL   HCT 68.6 (L) 63.9 - 53.9 %   MCV 85.8 80.0 - 100.0 fL   MCH 28.5 26.0 - 34.0 pg   MCHC 33.2 30.0 - 36.0 g/dL   RDW 86.9 88.4 - 84.4 %   Platelets 159 150 - 400 K/uL  nRBC 0.0 0.0 - 0.2 %    Assessment & Plan:   1. Wellness examination (Primary) - Encouraged a healthy well-balanced diet. Patient may adjust caloric intake to maintain or achieve ideal body weight. May reduce intake of dietary saturated fat and total fat and have adequate dietary potassium and calcium preferably from fresh fruits, vegetables, and low-fat dairy products.   - Advised to  avoid cigarette smoking. - Discussed with the patient that most people either abstain from alcohol or drink within safe limits (<=14/week and <=4 drinks/occasion for males, <=7/weeks and <= 3 drinks/occasion for females) and that the risk for alcohol disorders and other health effects rises proportionally with the number of drinks per week and how often a drinker exceeds daily limits. - Discussed cessation/primary prevention of drug use and availability of treatment for abuse.  - Discussed sexually transmitted diseases, avoidance of unintended pregnancy and contraceptive alternatives. - Stressed the importance of regular exercise - Injury prevention: Discussed safety belts, safety helmets, smoke detector, smoking near bedding or upholstery.  - Dental health: Discussed importance of regular tooth brushing, flossing, and dental visits.  - CBC with Differential/Platelet - Comprehensive metabolic panel with GFR - Hemoglobin A1c - Lipid panel - TSH Rfx on Abnormal to Free T4 NEXT PREVENTATIVE PHYSICAL DUE IN 1 YEAR.  Return in about 1 year (around 08/13/2024) for Physical with fasting labs.  Patient to reach out to office if new, worrisome, or unresolved symptoms arise or if no improvement in patient's condition. Patient verbalized understanding and is agreeable to treatment plan. All questions answered to patient's satisfaction.   Evalene Arts, FNP

## 2023-08-14 NOTE — Patient Instructions (Addendum)
 Health Maintenance Recommendations Screening Testing Mammogram Every 1 -2 years based on history and risk factors Starting at age 31 Pap Smear Ages 21-39 every 3 years Ages 67-65 every 5 years with HPV testing More frequent testing may be required based on results and history Colon Cancer Screening Every 1-10 years based on test performed, risk factors, and history Starting at age 29 Bone Density Screening Every 2-10 years based on history Starting at age 58 for women Recommendations for men differ based on medication usage, history, and risk factors AAA Screening One time ultrasound Men 24-6 years old who have every smoked Lung Cancer Screening Low Dose Lung CT every 12 months Age 54-80 years with a 30 pack-year smoking history who still smoke or who have quit within the last 15 years   Screening Labs Routine  Labs: Complete Blood Count (CBC), Complete Metabolic Panel (CMP), Cholesterol (Lipid Panel) Every 6-12 months based on history and medications May be recommended more frequently based on current conditions or previous results Hemoglobin A1c Lab Every 3-12 months based on history and previous results Starting at age 21 or earlier with diagnosis of diabetes, high cholesterol, BMI >26, and/or risk factors Frequent monitoring for patients with diabetes to ensure blood sugar control Thyroid  Panel (TSH w/ T3 & T4) Every 6 months based on history, symptoms, and risk factors May be repeated more often if on medication HIV One time testing for all patients 38 and older May be repeated more frequently for patients with increased risk factors or exposure Hepatitis C One time testing for all patients 13 and older May be repeated more frequently for patients with increased risk factors or exposure Gonorrhea, Chlamydia Every 12 months for all sexually active persons 13-24 years Additional monitoring may be recommended for those who are considered high risk or who have  symptoms PSA Men 38-57 years old with risk factors Additional screening may be recommended from age 88-69 based on risk factors, symptoms, and history   Vaccine Recommendations Tetanus Booster All adults every 10 years Flu Vaccine All patients 6 months and older every year COVID Vaccine All patients 12 years and older Initial dosing with booster May recommend additional booster based on age and health history HPV Vaccine 2 doses all patients age 38-26 Dosing may be considered for patients over 26 Shingles Vaccine (Shingrix) 2 doses all adults 55 years and older Pneumonia (Pneumovax 23) All adults 65 years and older May recommend earlier dosing based on health history Pneumonia (Prevnar 64) All adults 65 years and older Dosed 1 year after Pneumovax 23   Additional Screening, Testing, and Vaccinations may be recommended on an individualized basis based on family history, health history, risk factors, and/or exposure.  __________________________________________________________   Diet Recommendations for All Patients   I recommend that all patients maintain a diet low in saturated fats, carbohydrates, and cholesterol. While this can be challenging at first, it is not impossible and small changes can make big differences.  Things to try: Decreasing the amount of soda, sweet tea, and/or juice to one or less per day and replace with water While water is always the first choice, if you do not like water you may consider adding a water additive without sugar to improve the taste other sugar free drinks Replace potatoes with a brightly colored vegetable at dinner Use healthy oils, such as canola oil or olive oil, instead of butter or hard margarine Limit your bread intake to two pieces or less a day Replace regular pasta with  low carb pasta options Bake, broil, or grill foods instead of frying Monitor portion sizes  Eat smaller, more frequent meals throughout the day instead of large  meals   An important thing to remember is, if you love foods that are not great for your health, you don't have to give them up completely. Instead, allow these foods to be a reward when you have done well. Allowing yourself to still have special treats every once in a while is a nice way to tell yourself thank you for working hard to keep yourself healthy.    Also remember that every day is a new day. If you have a bad day and "fall off the wagon", you can still climb right back up and keep moving along on your journey!   We have resources available to help you!  Some websites that may be helpful include: www.http://www.wall-moore.info/        Www.[employer].com _____________________________________________________________   Activity Recommendations for All Patients   I recommend that all adults get at least 20 minutes of moderate physical activity that elevates your heart rate at least 5 days out of the week.  Some examples include: Walking or jogging at a pace that allows you to carry on a conversation Cycling (stationary bike or outdoors) Water aerobics Yoga Weight lifting Dancing If physical limitations prevent you from putting stress on your joints, exercise in a pool or seated in a chair are excellent options.   Do determine your MAXIMUM heart rate for activity: YOUR AGE - 220 = MAX HeartRate    Remember! Do not push yourself too hard.  Start slowly and build up your pace, speed, weight, time in exercise, etc.  Allow your body to rest between exercise and get good sleep. You will need more water than normal when you are exerting yourself. Do not wait until you are thirsty to drink. Drink with a purpose of getting in at least 8, 8 ounce glasses of water a day plus more depending on how much you exercise and sweat.      If you begin to develop dizziness, chest pain, abdominal pain, jaw pain, shortness of breath, headache, vision changes, lightheadedness, or other concerning symptoms, stop the  activity and allow your body to rest. If your symptoms are severe, seek emergency evaluation immediately. If your symptoms are concerning, but not severe, please let us  know so that we can recommend further evaluation.     MyChart:  For all urgent or time sensitive needs we ask that you please call the office to avoid delays. Our number is [phone]) [id number]. MyChart is not constantly monitored and due to the large volume of messages a day, replies may take up to 72 business hours.   MyChart Policy: MyChart allows for you to see your visit notes, after visit summary, provider recommendations, lab and tests results, make an appointment, request refills, and contact your provider or the office for non-urgent questions or concerns. Providers are seeing patients during normal business hours and do not have built in time to review MyChart messages.  We ask that you allow a minimum of 3 business days for responses to [employer]. For this reason, please do not send urgent requests through MyChart. Please call the office at [phone]. New and ongoing conditions may require a visit. We have virtual and in person visit available for your convenience.  Complex MyChart concerns may require a visit. Your provider may request you schedule a virtual or in person visit to  ensure we are providing the best care possible. MyChart messages sent after 11:00 AM on Friday will not be received by the provider until Monday morning.    Lab and Test Results: You will receive your lab and test results on MyChart as soon as they are completed and results have been sent by the lab or testing facility. Due to this service, you will receive your results BEFORE your provider.  I review lab and tests results each morning prior to seeing patients. Some results require collaboration with other providers to ensure you are receiving the most appropriate care. For this reason, we ask that you please allow a minimum of 3-5 business  days from the time the ALL results have been received for your provider to receive and review lab and test results and contact you about these.  Most lab and test result comments from the provider will be sent through MyChart. Your provider may recommend changes to the plan of care, follow-up visits, repeat testing, ask questions, or request an office visit to discuss these results. You may reply directly to this message or call the office at [phone] to provide information for the provider or set up an appointment. In some instances, you will be called with test results and recommendations. Please let us  know if this is preferred and we will make note of this in your chart to provide this for you.    If you have not heard a response to your lab or test results in 5 business days from all results returning to MyChart, please call the office to let us  know. We ask that you please avoid calling prior to this time unless there is an emergent concern. Due to high call volumes, this can delay the resulting process.   After Hours: For all non-emergency after hours needs, please call the office at [phone] and select the option to reach the on-call provider service. On-call services are shared between multiple [facility] offices and therefore it will not be possible to speak directly with your provider. On-call providers may provide medical advice and recommendations, but are unable to provide refills for maintenance medications.  For all emergency or urgent medical needs after normal business hours, we recommend that you seek care at the closest Urgent Care or Emergency Department to ensure appropriate treatment in a timely manner.  [facility] has a 24 hour emergency room located on the ground floor for your convenience.    Urgent Concerns During the Business Day Providers are seeing patients from 8AM to 5PM, Monday through Thursday, and 8AM to 12PM on Friday with a busy  schedule and are most often not able to respond to non-urgent calls until the end of the day or the next business day. If you should have URGENT concerns during the day, please call and speak to the nurse or schedule a same day appointment so that we can address your concern without delay.    Thank you, again, for choosing me as your health care partner. I appreciate your trust and look forward to learning more about you.    Wilhelmena Hanson, FNP-C

## 2023-08-15 LAB — CBC WITH DIFFERENTIAL/PLATELET
Basophils Absolute: 0 x10E3/uL (ref 0.0–0.2)
Basos: 1 %
EOS (ABSOLUTE): 0.1 x10E3/uL (ref 0.0–0.4)
Eos: 1 %
Hematocrit: 39.9 % (ref 34.0–46.6)
Hemoglobin: 12.5 g/dL (ref 11.1–15.9)
Immature Grans (Abs): 0 x10E3/uL (ref 0.0–0.1)
Immature Granulocytes: 0 %
Lymphocytes Absolute: 2.3 x10E3/uL (ref 0.7–3.1)
Lymphs: 33 %
MCH: 28.7 pg (ref 26.6–33.0)
MCHC: 31.3 g/dL — ABNORMAL LOW (ref 31.5–35.7)
MCV: 92 fL (ref 79–97)
Monocytes Absolute: 0.5 x10E3/uL (ref 0.1–0.9)
Monocytes: 7 %
Neutrophils Absolute: 4 x10E3/uL (ref 1.4–7.0)
Neutrophils: 58 %
Platelets: 230 x10E3/uL (ref 150–450)
RBC: 4.36 x10E6/uL (ref 3.77–5.28)
RDW: 12.4 % (ref 11.7–15.4)
WBC: 6.8 x10E3/uL (ref 3.4–10.8)

## 2023-08-15 LAB — COMPREHENSIVE METABOLIC PANEL WITH GFR
ALT: 22 IU/L (ref 0–32)
AST: 18 IU/L (ref 0–40)
Albumin: 4.7 g/dL (ref 4.0–5.0)
Alkaline Phosphatase: 84 IU/L (ref 44–121)
BUN/Creatinine Ratio: 17 (ref 9–23)
BUN: 14 mg/dL (ref 6–20)
Bilirubin Total: 0.5 mg/dL (ref 0.0–1.2)
CO2: 21 mmol/L (ref 20–29)
Calcium: 9.7 mg/dL (ref 8.7–10.2)
Chloride: 100 mmol/L (ref 96–106)
Creatinine, Ser: 0.82 mg/dL (ref 0.57–1.00)
Globulin, Total: 2.7 g/dL (ref 1.5–4.5)
Glucose: 83 mg/dL (ref 70–99)
Potassium: 4.3 mmol/L (ref 3.5–5.2)
Sodium: 138 mmol/L (ref 134–144)
Total Protein: 7.4 g/dL (ref 6.0–8.5)
eGFR: 99 mL/min/1.73 (ref >59)

## 2023-08-15 LAB — HEMOGLOBIN A1C
Est. average glucose Bld gHb Est-mCnc: 100 mg/dL
Hgb A1c MFr Bld: 5.1 % (ref 4.8–5.6)

## 2023-08-15 LAB — LIPID PANEL
Chol/HDL Ratio: 2.4 ratio (ref 0.0–4.4)
Cholesterol, Total: 161 mg/dL (ref 100–199)
HDL: 66 mg/dL (ref >39)
LDL Chol Calc (NIH): 88 mg/dL (ref 0–99)
Triglycerides: 28 mg/dL (ref 0–149)
VLDL Cholesterol Cal: 7 mg/dL (ref 5–40)

## 2023-08-15 LAB — TSH RFX ON ABNORMAL TO FREE T4: TSH: 0.84 u[IU]/mL (ref 0.450–4.500)

## 2023-08-19 ENCOUNTER — Ambulatory Visit: Payer: Self-pay | Admitting: Family Medicine
